# Patient Record
Sex: Female | Born: 1990 | Race: White | Hispanic: No | Marital: Married | Smoking: Never smoker
Health system: Southern US, Community
[De-identification: ages and names within clinical notes are randomized; demographics above are authoritative.]

## PROBLEM LIST (undated history)

## (undated) ENCOUNTER — Inpatient Hospital Stay: Payer: Self-pay

## (undated) DIAGNOSIS — N390 Urinary tract infection, site not specified: Secondary | ICD-10-CM

## (undated) DIAGNOSIS — O24419 Gestational diabetes mellitus in pregnancy, unspecified control: Secondary | ICD-10-CM

## (undated) DIAGNOSIS — J069 Acute upper respiratory infection, unspecified: Secondary | ICD-10-CM

## (undated) DIAGNOSIS — Z789 Other specified health status: Secondary | ICD-10-CM

## (undated) HISTORY — DX: Gestational diabetes mellitus in pregnancy, unspecified control: O24.419

## (undated) HISTORY — PX: NO PAST SURGERIES: SHX2092

## (undated) HISTORY — DX: Urinary tract infection, site not specified: N39.0

---
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
## 2010-06-19 ENCOUNTER — Ambulatory Visit: Payer: Self-pay | Admitting: Surgery

## 2011-07-14 ENCOUNTER — Ambulatory Visit: Payer: Self-pay

## 2014-09-05 LAB — HM PAP SMEAR: HM PAP: NORMAL

## 2014-10-27 NOTE — L&D Delivery Note (Signed)
Delivery Note At 12:26 AM a viable female was delivered via Vaginal, Spontaneous Delivery (Presentation: ; Occiput Anterior).  APGAR: 7, 9; weight  Pending due to skin-to-skin time.   Placenta status: Intact, Spontaneous Expressed.  Cord: 3 vessels with the following complications: None.  Cord pH: Not obtained  Anesthesia: Epidural  Episiotomy: None Lacerations: 1st degree;Vaginal Suture Repair: 3.0 vicryl Est. Blood Loss (mL): 400  Mom to postpartum.  Baby to Couplet care / Skin to Skin.  Called to see patient.  Mom pushed to delivery viable female infant.  The head followed by shoulders, which delivered without difficulty, and the rest of the body.  No nuchal cord noted.  Baby to mom's chest.  Cord clamped and cut after > 1 min delay.  No cord blood obtained.  Placenta delivered spontaneously, intact, with a 3-vessel cord.  First degree vaginal laceration repaired with 3-0 Vicryl in standard fashion.  All counts correct.  Hemostasis obtained with IV pitocin and fundal massage. EBL 400 mL.    Conard NovakJackson, Emmett Bracknell D, MD 09/08/2015, 12:49 AM

## 2015-01-10 LAB — OB RESULTS CONSOLE HIV ANTIBODY (ROUTINE TESTING): HIV: NONREACTIVE

## 2015-01-10 LAB — OB RESULTS CONSOLE ABO/RH: RH TYPE: POSITIVE

## 2015-01-10 LAB — OB RESULTS CONSOLE GC/CHLAMYDIA
Chlamydia: NEGATIVE
Gonorrhea: NEGATIVE

## 2015-01-10 LAB — OB RESULTS CONSOLE RUBELLA ANTIBODY, IGM: RUBELLA: IMMUNE

## 2015-01-10 LAB — OB RESULTS CONSOLE HEPATITIS B SURFACE ANTIGEN: HEP B S AG: NEGATIVE

## 2015-01-10 LAB — OB RESULTS CONSOLE VARICELLA ZOSTER ANTIBODY, IGG: Varicella: IMMUNE

## 2015-01-10 LAB — OB RESULTS CONSOLE ANTIBODY SCREEN: Antibody Screen: NEGATIVE

## 2015-01-10 LAB — OB RESULTS CONSOLE RPR: RPR: NONREACTIVE

## 2015-08-17 LAB — OB RESULTS CONSOLE GC/CHLAMYDIA
Chlamydia: NEGATIVE
Gonorrhea: NEGATIVE

## 2015-08-17 LAB — OB RESULTS CONSOLE GBS: GBS: NEGATIVE

## 2015-09-01 ENCOUNTER — Observation Stay
Admission: EM | Admit: 2015-09-01 | Discharge: 2015-09-01 | Disposition: A | Discharge status: Home / Self Care | Payer: BLUE CROSS/BLUE SHIELD | Attending: Obstetrics and Gynecology | Admitting: Obstetrics and Gynecology

## 2015-09-01 ENCOUNTER — Encounter: Payer: Self-pay | Admitting: *Deleted

## 2015-09-01 DIAGNOSIS — Z3400 Encounter for supervision of normal first pregnancy, unspecified trimester: Secondary | ICD-10-CM

## 2015-09-01 DIAGNOSIS — Z3A40 40 weeks gestation of pregnancy: Secondary | ICD-10-CM | POA: Insufficient documentation

## 2015-09-01 HISTORY — DX: Other specified health status: Z78.9

## 2015-09-01 NOTE — OB Triage Note (Signed)
Cramping started yesterday after membranes stripped in office. Contractions with back pain since last night. Worsening this am around 0930. Reports loss of mucous plug.Elaina HoopsElks, Muhammed Teutsch S

## 2015-09-01 NOTE — Final Progress Note (Signed)
Physician Final Progress Note  Patient ID: Tiffany NoaStacie N Shaw Poke MRN: [id number] DOB/AGE: 25/03/1991 24 y.o.  Admit date: 09/01/2015 Admitting provider: Conard NovakStephen D Adonys Wildes, MD Discharge date: 09/01/2015   Admission Diagnoses:  1) Intrauterine pregnancy at 3676w1d  2) regular uterine contractions  Discharge Diagnoses:  1) Intrauterine pregnancy at 476w1d  2) regular uterine contractions, no evidence of labor   Consults: None  Significant Findings/ Diagnostic Studies: Reactive NST (baseline 140/ mod var / +accels / a single periodic deceleration)  Procedures: NST, as above  Discharge Condition: stable  Disposition: Final discharge disposition not confirmed  Diet: Regular diet  Discharge Activity: Activity as tolerated     Medication List    TAKE these medications        prenatal vitamin w/FE, FA 27-1 MG Tabs tablet  Take 1 tablet by mouth daily at 12 noon.         Total time spent taking care of this patient: 20 minutes  Signed: Conard NovakJackson, Nikola Marone D 09/01/2015, 12:53 PM

## 2015-09-03 ENCOUNTER — Observation Stay
Admission: EM | Admit: 2015-09-03 | Discharge: 2015-09-04 | Disposition: A | Discharge status: Home / Self Care | Payer: BLUE CROSS/BLUE SHIELD | Attending: Obstetrics and Gynecology | Admitting: Obstetrics and Gynecology

## 2015-09-03 ENCOUNTER — Encounter: Payer: Self-pay | Admitting: *Deleted

## 2015-09-03 DIAGNOSIS — Z3403 Encounter for supervision of normal first pregnancy, third trimester: Secondary | ICD-10-CM

## 2015-09-03 DIAGNOSIS — Z3A39 39 weeks gestation of pregnancy: Secondary | ICD-10-CM | POA: Diagnosis not present

## 2015-09-03 DIAGNOSIS — O4292 Full-term premature rupture of membranes, unspecified as to length of time between rupture and onset of labor: Principal | ICD-10-CM | POA: Insufficient documentation

## 2015-09-03 NOTE — OB Triage Note (Signed)
Pt reports being here on Sun with query early labor, d/c'ed home. Today noticed leaking of cl fluid vaginally onsetting at 1900. No u/c's reported

## 2015-09-03 NOTE — Final Progress Note (Signed)
Physician Final Progress Note  Patient ID: Tiffany LoaderStacie Bertram MRN: [id number] DOB/AGE: 25/03/1991 24 y.o.  Admit date: 09/03/2015 Admitting provider: Vena AustriaAndreas Skyylar Kopf, MD Discharge date: 09/03/2015   Admission Diagnoses: Leaking fluid  Discharge Diagnoses:  Active Problems:   Amniotic fluid leaking   Consults: none  Significant Findings/ Diagnostic Studies:  NST reactive, negative ferning, negative pooling, negative nitrazine  Procedures: NST  Discharge Condition: stable  Disposition: 01-Home or Self Care  Diet: general  Discharge Activity: as tolerated  Discharge Instructions    Discharge activity:  No Restrictions    Complete by:  As directed      Discharge diet:  No restrictions    Complete by:  As directed      Fetal Kick Count:  Lie on our left side for one hour after a meal, and count the number of times your baby kicks.  If it is less than 5 times, get up, move around and drink some juice.  Repeat the test 30 minutes later.  If it is still less than 5 kicks in an hour, notify your doctor.    Complete by:  As directed      LABOR:  When conractions begin, you should start to time them from the beginning of one contraction to the beginning  of the next.  When contractions are 5 - 10 minutes apart or less and have been regular for at least an hour, you should call your health care provider.    Complete by:  As directed      No sexual activity restrictions    Complete by:  As directed      Notify physician for bleeding from the vagina    Complete by:  As directed      Notify physician for blurring of vision or spots before the eyes    Complete by:  As directed      Notify physician for chills or fever    Complete by:  As directed      Notify physician for fainting spells, "black outs" or loss of consciousness    Complete by:  As directed      Notify physician for increase in vaginal discharge    Complete by:  As directed      Notify physician for leaking of fluid     Complete by:  As directed      Notify physician for pain or burning when urinating    Complete by:  As directed      Notify physician for pelvic pressure (sudden increase)    Complete by:  As directed      Notify physician for severe or continued nausea or vomiting    Complete by:  As directed      Notify physician for sudden gushing of fluid from the vagina (with or without continued leaking)    Complete by:  As directed      Notify physician for sudden, constant, or occasional abdominal pain    Complete by:  As directed      Notify physician if baby moving less than usual    Complete by:  As directed             Medication List    TAKE these medications        prenatal vitamin w/FE, FA 27-1 MG Tabs tablet  Take 1 tablet by mouth daily at 12 noon.        Total time spent taking care of this patient: 20 minutes  SignedLorrene Reid 09/03/2015, 11:40 PM

## 2015-09-04 DIAGNOSIS — O4292 Full-term premature rupture of membranes, unspecified as to length of time between rupture and onset of labor: Secondary | ICD-10-CM | POA: Diagnosis not present

## 2015-09-04 NOTE — OB Triage Note (Signed)
Pt d/c'ed walking with spouse to car. Dr Bonney AidStaebler providing d/c instructions. Instructions printed and reviewed with pt, spouse present. No further questions or concerns.

## 2015-09-07 ENCOUNTER — Encounter: Payer: Self-pay | Admitting: *Deleted

## 2015-09-07 ENCOUNTER — Inpatient Hospital Stay: Payer: BLUE CROSS/BLUE SHIELD | Admitting: Anesthesiology

## 2015-09-07 ENCOUNTER — Inpatient Hospital Stay
Admission: EM | Admit: 2015-09-07 | Discharge: 2015-09-09 | DRG: 775 | Disposition: A | Discharge status: Home / Self Care | Payer: BLUE CROSS/BLUE SHIELD | Attending: Obstetrics and Gynecology | Admitting: Obstetrics and Gynecology

## 2015-09-07 DIAGNOSIS — Z3A40 40 weeks gestation of pregnancy: Secondary | ICD-10-CM | POA: Diagnosis not present

## 2015-09-07 DIAGNOSIS — O4290 Premature rupture of membranes, unspecified as to length of time between rupture and onset of labor, unspecified weeks of gestation: Secondary | ICD-10-CM | POA: Diagnosis present

## 2015-09-07 DIAGNOSIS — Z3403 Encounter for supervision of normal first pregnancy, third trimester: Secondary | ICD-10-CM

## 2015-09-07 DIAGNOSIS — O133 Gestational [pregnancy-induced] hypertension without significant proteinuria, third trimester: Secondary | ICD-10-CM | POA: Diagnosis present

## 2015-09-07 DIAGNOSIS — O4200 Premature rupture of membranes, onset of labor within 24 hours of rupture, unspecified weeks of gestation: Secondary | ICD-10-CM | POA: Diagnosis present

## 2015-09-07 DIAGNOSIS — O4210 Premature rupture of membranes, onset of labor more than 24 hours following rupture, unspecified weeks of gestation: Secondary | ICD-10-CM | POA: Diagnosis present

## 2015-09-07 DIAGNOSIS — O134 Gestational [pregnancy-induced] hypertension without significant proteinuria, complicating childbirth: Secondary | ICD-10-CM | POA: Diagnosis present

## 2015-09-07 DIAGNOSIS — Z3400 Encounter for supervision of normal first pregnancy, unspecified trimester: Secondary | ICD-10-CM

## 2015-09-07 LAB — CHLAMYDIA/NGC RT PCR (ARMC ONLY)
Chlamydia Tr: NOT DETECTED
N gonorrhoeae: NOT DETECTED

## 2015-09-07 LAB — COMPREHENSIVE METABOLIC PANEL
ALT: 15 U/L (ref 14–54)
AST: 26 U/L (ref 15–41)
Albumin: 3.4 g/dL — ABNORMAL LOW (ref 3.5–5.0)
Alkaline Phosphatase: 128 U/L — ABNORMAL HIGH (ref 38–126)
Anion gap: 7 (ref 5–15)
BILIRUBIN TOTAL: 0.6 mg/dL (ref 0.3–1.2)
BUN: 8 mg/dL (ref 6–20)
CO2: 23 mmol/L (ref 22–32)
CREATININE: 0.56 mg/dL (ref 0.44–1.00)
Calcium: 9.4 mg/dL (ref 8.9–10.3)
Chloride: 107 mmol/L (ref 101–111)
GFR calc Af Amer: >60 mL/min (ref >60)
Glucose, Bld: 82 mg/dL (ref 65–99)
Potassium: 3.6 mmol/L (ref 3.5–5.1)
Sodium: 137 mmol/L (ref 135–145)
TOTAL PROTEIN: 7 g/dL (ref 6.5–8.1)

## 2015-09-07 LAB — CBC
HCT: 35.1 % (ref 35.0–47.0)
Hemoglobin: 11.9 g/dL — ABNORMAL LOW (ref 12.0–16.0)
MCH: 31.1 pg (ref 26.0–34.0)
MCHC: 34 g/dL (ref 32.0–36.0)
MCV: 91.5 fL (ref 80.0–100.0)
PLATELETS: 135 10*3/uL — AB (ref 150–440)
RBC: 3.83 MIL/uL (ref 3.80–5.20)
RDW: 13 % (ref 11.5–14.5)
WBC: 8.9 10*3/uL (ref 3.6–11.0)

## 2015-09-07 LAB — PROTEIN / CREATININE RATIO, URINE
CREATININE, URINE: 108 mg/dL
Protein Creatinine Ratio: 0.23 mg/mg{Cre} — ABNORMAL HIGH (ref 0.00–0.15)
Total Protein, Urine: 25 mg/dL

## 2015-09-07 LAB — TYPE AND SCREEN
ABO/RH(D): AB POS
Antibody Screen: NEGATIVE

## 2015-09-07 LAB — ABO/RH: ABO/RH(D): AB POS

## 2015-09-07 MED ORDER — LACTATED RINGERS IV SOLN
INTRAVENOUS | Status: DC
  Administered 2015-09-07: 14:00:00 via INTRAVENOUS

## 2015-09-07 MED ORDER — MISOPROSTOL 200 MCG PO TABS
800.0000 ug | ORAL_TABLET | ORAL | Status: DC | PRN
  Filled 2015-09-07 (×2): qty 4

## 2015-09-07 MED ORDER — LIDOCAINE-EPINEPHRINE (PF) 1.5 %-1:200000 IJ SOLN
INTRAMUSCULAR | Status: DC | PRN
  Administered 2015-09-07: 3 mL via EPIDURAL

## 2015-09-07 MED ORDER — OXYTOCIN 40 UNITS IN LACTATED RINGERS INFUSION - SIMPLE MED: 62.5000 mL/h | INTRAVENOUS | Status: DC

## 2015-09-07 MED ORDER — FENTANYL 2.5 MCG/ML W/ROPIVACAINE 0.2% IN NS 100 ML EPIDURAL INFUSION (ARMC-ANES)
EPIDURAL | Status: AC
  Administered 2015-09-07: 10 mL/h via EPIDURAL
  Filled 2015-09-07: qty 100

## 2015-09-07 MED ORDER — ONDANSETRON HCL 4 MG/2ML IJ SOLN: 4.0000 mg | Freq: Four times a day (QID) | INTRAMUSCULAR | Status: DC | PRN

## 2015-09-07 MED ORDER — OXYTOCIN BOLUS FROM INFUSION
500.0000 mL | INTRAVENOUS | Status: DC
  Administered 2015-09-08: 500 mL via INTRAVENOUS

## 2015-09-07 MED ORDER — TERBUTALINE SULFATE 1 MG/ML IJ SOLN: 0.2500 mg | Freq: Once | INTRAMUSCULAR | Status: DC | PRN

## 2015-09-07 MED ORDER — FENTANYL CITRATE (PF) 100 MCG/2ML IJ SOLN
50.0000 ug | INTRAMUSCULAR | Status: AC
  Administered 2015-09-07: 50 ug via INTRAVENOUS
  Filled 2015-09-07: qty 2

## 2015-09-07 MED ORDER — SODIUM CHLORIDE 0.9 % IV SOLN
INTRAVENOUS | Status: DC | PRN
  Administered 2015-09-07 (×2): 5 mL via EPIDURAL

## 2015-09-07 MED ORDER — OXYTOCIN 40 UNITS IN LACTATED RINGERS INFUSION - SIMPLE MED
1.0000 m[IU]/min | INTRAVENOUS | Status: DC
  Administered 2015-09-07: 1 m[IU]/min via INTRAVENOUS
  Filled 2015-09-07: qty 1000

## 2015-09-07 MED ORDER — LACTATED RINGERS IV SOLN: 500.0000 mL | INTRAVENOUS | Status: DC | PRN

## 2015-09-07 MED ORDER — AMMONIA AROMATIC IN INHA
0.3000 mL | Freq: Once | RESPIRATORY_TRACT | Status: DC | PRN
  Filled 2015-09-07: qty 10

## 2015-09-07 MED ORDER — LIDOCAINE HCL (PF) 1 % IJ SOLN
30.0000 mL | INTRAMUSCULAR | Status: DC | PRN
  Filled 2015-09-07: qty 30

## 2015-09-07 NOTE — Anesthesia Procedure Notes (Signed)
Epidural Patient location during procedure: OB Start time: 09/07/2015 7:24 PM End time: 09/07/2015 7:34 PM  Staffing Anesthesiologist: Lenard SimmerKARENZ, Erling Arrazola Performed by: anesthesiologist   Preanesthetic Checklist Completed: patient identified, site marked, surgical consent, pre-op evaluation, timeout performed, IV checked, risks and benefits discussed and monitors and equipment checked  Epidural Patient position: sitting Prep: ChloraPrep Patient monitoring: heart rate, continuous pulse ox and blood pressure Approach: midline Location: L3-L4 Injection technique: LOR saline  Needle:  Needle type: Tuohy  Needle gauge: 18 G Needle length: 9 cm and 9 Needle insertion depth: 4.5 cm Catheter type: closed end flexible Catheter size: 20 Guage Catheter at skin depth: 9 cm Test dose: negative and 1.5% lidocaine with Epi 1:200 K  Assessment Events: blood not aspirated, injection not painful, no injection resistance, negative IV test and no paresthesia  Additional Notes   Patient tolerated the insertion well without complications.Reason for block:procedure for pain

## 2015-09-07 NOTE — Anesthesia Preprocedure Evaluation (Signed)
Anesthesia Evaluation  Patient identified by MRN, date of birth, ID band Patient awake    Reviewed: Allergy & Precautions, H&P , NPO status , Patient's Chart, lab work & pertinent test results, reviewed documented beta blocker date and time   History of Anesthesia Complications Negative for: history of anesthetic complications  Airway Mallampati: III  TM Distance: >3 FB Neck ROM: full    Dental no notable dental hx. (+) Teeth Intact   Pulmonary neg pulmonary ROS,    Pulmonary exam normal breath sounds clear to auscultation       Cardiovascular Exercise Tolerance: Good negative cardio ROS Normal cardiovascular exam Rhythm:regular Rate:Normal     Neuro/Psych negative neurological ROS  negative psych ROS   GI/Hepatic negative GI ROS, Neg liver ROS,   Endo/Other  negative endocrine ROS  Renal/GU negative Renal ROS  negative genitourinary   Musculoskeletal   Abdominal   Peds  Hematology negative hematology ROS (+)   Anesthesia Other Findings Past Medical History:   Medical history non-contributory                             Reproductive/Obstetrics (+) Pregnancy                             Anesthesia Physical Anesthesia Plan  ASA: II  Anesthesia Plan: Epidural   Post-op Pain Management:    Induction:   Airway Management Planned:   Additional Equipment:   Intra-op Plan:   Post-operative Plan:   Informed Consent: I have reviewed the patients History and Physical, chart, labs and discussed the procedure including the risks, benefits and alternatives for the proposed anesthesia with the patient or authorized representative who has indicated his/her understanding and acceptance.   Dental Advisory Given  Plan Discussed with: Anesthesiologist, CRNA and Surgeon  Anesthesia Plan Comments:         Anesthesia Quick Evaluation

## 2015-09-07 NOTE — Progress Notes (Addendum)
Patient ID: Arsenio LoaderStacie Veras, female   DOB: 01/25/1991, 24 y.o.   MRN: [id number] L&D Note    Subjective:  Becoming more uncomfortable, asking for pain medication.  Objective:   Filed Vitals:   09/07/15 1419 09/07/15 1434 09/07/15 1449 09/07/15 1559  BP: 138/76 143/80 136/86 140/85  Pulse: 99 96 89 90  Temp:      TempSrc:      Resp:      Height:      Weight:        Current Vital Signs 24h Vital Sign Ranges  T 98.7 F (37.1 C) Temp  Avg: 98.7 F (37.1 C)  Min: 98.7 F (37.1 C)  Max: 98.7 F (37.1 C)  BP 140/85 mmHg BP  Min: 130/91  Max: 143/80  HR 90 Pulse  Avg: 93.9  Min: 87  Max: 107  RR 18 Resp  Avg: 18  Min: 18  Max: 18  SaO2     No Data Recorded      Gen: moderate distress w ctx FHR: 130/mod var/+accels/no decles Toco: 3  q 10 min SVE: 4/90/-1  Lab Results  Component Value Date   WBC 8.9 09/07/2015   HGB 11.9* 09/07/2015   HCT 35.1 09/07/2015   PLT 135* 09/07/2015   CREATININE 0.56 09/07/2015   PROTCRRATIO 0.23* 09/07/2015    Medications SCHEDULED MEDICATIONS  . fentaNYL (SUBLIMAZE) injection  50 mcg Intravenous STAT    MEDICATION INFUSIONS  . lactated ringers 125 mL/hr at 09/07/15 1400  . oxytocin 40 units in LR 1000 mL    . oxytocin 40 units in LR 1000 mL    . oxytocin 40 units in LR 1000 mL 9 milli-units/min (09/07/15 1630)    PRN MEDICATIONS  ammonia, lactated ringers, lidocaine (PF), misoprostol, ondansetron, terbutaline   Assessment & Plan:  24 y.o. G1P0000 at 7133w0d PROM, gestational hypertension *Labor: continue pitocin per protocol *Fetal Well-being: reassuring overall *GBS:neg *Gestational hypertension: monitor for now.  No evidence of preeclampsia. *Analgesia: fentanyl 50mcg x 1 now.  Patient now interested in epidural. May have whenever.  Conard NovakJackson, Aldean Pipe D, MD  09/07/2015 5:42 PM  [facility]

## 2015-09-07 NOTE — H&P (Signed)
OB History & Physical   History of Present Illness:  Chief Complaint: Sent over from office for verified rupture of membranes  HPI:  Tiffany Wiley is a 24 y.o. G1P0000 female at 6568w0d dated by 8 week ultrasound.  Her pregnancy has been uncomplicated.    She reports contractions intermittently and that are not very strong.   She reports leakage of fluid since Wednesday (two days ago).  She has had several hospital visits with concern for fluid leakage, and has tested negative each time.   She denies vaginal bleeding.   She reports fetal movement.  She denies fevers and abdominal pain.  She denies headache, visual disturbances, and RUQ pain.  Maternal Medical History:   Past Medical History  Diagnosis Date  . Medical history non-contributory     Past Surgical History  Procedure Laterality Date  . No past surgeries      Allergies  Allergen Reactions  . Other Hives, Shortness Of Breath and Swelling    Blueberries  . Shellfish Allergy Hives and Swelling    Prior to Admission medications   Medication Sig Start Date End Date Taking? Authorizing Provider  prenatal vitamin w/FE, FA (PRENATAL 1 + 1) 27-1 MG TABS tablet Take 1 tablet by mouth daily at 12 noon.   Yes Historical Provider, MD    OB History  Gravida Para Term Preterm AB SAB TAB Ectopic Multiple Living  1 0 0 0 0 0 0 0 0 0     # Outcome Date GA Lbr Len/2nd Weight Sex Delivery Anes PTL Lv  1 Current               Prenatal care site: [facility]  Social History: She  reports that she has never smoked. She has never used smokeless tobacco. She reports that she does not drink alcohol or use illicit drugs.  Family History: family history is not on file.   Review of Systems: Negative x 10 systems reviewed except as noted in the HPI.    Physical Exam:  Vital Signs: BP 130/91 mmHg  Pulse 107  Temp(Src) 98.7 F (37.1 C) (Oral)  Resp 18  Ht 5\' 3"  (1.6 m)  Wt 162 lb (73.483 kg)  BMI 28.70 kg/m2 General: no acute  distress.  HEENT: normocephalic, atraumatic Heart: regular rate & rhythm.  No murmurs/rubs/gallops Lungs: clear to auscultation bilaterally Abdomen: soft, gravid, non-tender;  EFW: 8 pounds Pelvic:   External: Normal external female genitalia  Cervix: Dilation: 3 / Effacement (%): 70, 80 / Station: -2   ROM: + pooling; + nitrazine; + ferning in clinic today.  Not repeated on L&D Extremities: non-tender, symmetric, no edema bilaterally.  DTRs: 2+  Neurologic: Alert & oriented x 3.    Pertinent Results:  Prenatal Labs: Blood type/Rh AB positive  Antibody screen negative  Rubella Varicella Immune Immune  RPR Non reactive  HBsAg negative  HIV negative  GC negative  Chlamydia negative  Genetic screening declined  1 hour GTT 131  3 hour GTT n/a  GBS negative on 08/15/15   Baseline FHR: 135 beats/min   Variability: moderate   Accelerations: present   Decelerations: absent Contractions: present frequency: infrequenty Overall assessment: category 1  Assessment:  Tiffany Wiley is a 24 y.o. 401P0000 female at 5968w0d with premature rupture of membranes for possibly 48 hours.   Plan:  1. Admit to Labor & Delivery  2. CBC, T&S, Clrs, IVF 3. Elevated BPs: preeclampsia labs, urine protein/creatinine ratio. 4. PROM for 48 hours: will  monitor for signs of chorioamnionitis. Per CDC guidelines, will only treat with fever. 5. GBS negative.   6. Fetwal well-being: 08/15/15   Conard Novak, MD 09/07/2015 1:48 PM

## 2015-09-08 ENCOUNTER — Encounter: Payer: Self-pay | Admitting: Obstetrics and Gynecology

## 2015-09-08 LAB — CBC
HEMATOCRIT: 32.4 % — AB (ref 35.0–47.0)
HEMOGLOBIN: 11 g/dL — AB (ref 12.0–16.0)
MCH: 31 pg (ref 26.0–34.0)
MCHC: 34 g/dL (ref 32.0–36.0)
MCV: 91.3 fL (ref 80.0–100.0)
Platelets: 121 10*3/uL — ABNORMAL LOW (ref 150–440)
RBC: 3.54 MIL/uL — ABNORMAL LOW (ref 3.80–5.20)
RDW: 13.2 % (ref 11.5–14.5)
WBC: 14.8 10*3/uL — ABNORMAL HIGH (ref 3.6–11.0)

## 2015-09-08 LAB — RPR: RPR: NONREACTIVE

## 2015-09-08 MED ORDER — PRENATAL MULTIVITAMIN CH
1.0000 | ORAL_TABLET | Freq: Every day | ORAL | Status: DC
  Administered 2015-09-08 – 2015-09-09 (×2): 1 via ORAL
  Filled 2015-09-08 (×2): qty 1

## 2015-09-08 MED ORDER — DIBUCAINE 1 % RE OINT: 1.0000 "application " | TOPICAL_OINTMENT | RECTAL | Status: DC | PRN

## 2015-09-08 MED ORDER — NALBUPHINE HCL 10 MG/ML IJ SOLN
5.0000 mg | INTRAMUSCULAR | Status: DC | PRN
  Filled 2015-09-08: qty 0.5

## 2015-09-08 MED ORDER — IBUPROFEN 600 MG PO TABS
600.0000 mg | ORAL_TABLET | Freq: Four times a day (QID) | ORAL | Status: DC
Start: 2015-09-08 — End: 2015-09-09
  Administered 2015-09-08 – 2015-09-09 (×4): 600 mg via ORAL
  Filled 2015-09-08 (×4): qty 1

## 2015-09-08 MED ORDER — NALBUPHINE HCL 10 MG/ML IJ SOLN
5.0000 mg | Freq: Once | INTRAMUSCULAR | Status: DC | PRN
  Filled 2015-09-08: qty 0.5

## 2015-09-08 MED ORDER — DIPHENHYDRAMINE HCL 50 MG/ML IJ SOLN: 12.5000 mg | INTRAMUSCULAR | Status: DC | PRN

## 2015-09-08 MED ORDER — ONDANSETRON HCL 4 MG/2ML IJ SOLN: 4.0000 mg | INTRAMUSCULAR | Status: DC | PRN

## 2015-09-08 MED ORDER — WITCH HAZEL-GLYCERIN EX PADS: 1.0000 "application " | MEDICATED_PAD | CUTANEOUS | Status: DC | PRN

## 2015-09-08 MED ORDER — BENZOCAINE-MENTHOL 20-0.5 % EX AERO: 1.0000 "application " | INHALATION_SPRAY | CUTANEOUS | Status: DC | PRN

## 2015-09-08 MED ORDER — ONDANSETRON HCL 4 MG PO TABS: 4.0000 mg | ORAL_TABLET | ORAL | Status: DC | PRN

## 2015-09-08 MED ORDER — LANOLIN HYDROUS EX OINT: TOPICAL_OINTMENT | CUTANEOUS | Status: DC | PRN

## 2015-09-08 MED ORDER — ACETAMINOPHEN 325 MG PO TABS: 650.0000 mg | ORAL_TABLET | ORAL | Status: DC | PRN

## 2015-09-08 MED ORDER — HYDROCODONE-ACETAMINOPHEN 5-325 MG PO TABS: 2.0000 | ORAL_TABLET | ORAL | Status: DC | PRN

## 2015-09-08 MED ORDER — KETOROLAC TROMETHAMINE 30 MG/ML IJ SOLN: 30.0000 mg | Freq: Four times a day (QID) | INTRAMUSCULAR | Status: DC | PRN

## 2015-09-08 MED ORDER — SIMETHICONE 80 MG PO CHEW: 80.0000 mg | CHEWABLE_TABLET | ORAL | Status: DC | PRN

## 2015-09-08 MED ORDER — NALOXONE HCL 2 MG/2ML IJ SOSY
1.0000 ug/kg/h | PREFILLED_SYRINGE | INTRAVENOUS | Status: DC | PRN
  Filled 2015-09-08: qty 2

## 2015-09-08 MED ORDER — SODIUM CHLORIDE 0.9 % IJ SOLN: 3.0000 mL | INTRAMUSCULAR | Status: DC | PRN

## 2015-09-08 MED ORDER — MEPERIDINE HCL 25 MG/ML IJ SOLN: 6.2500 mg | INTRAMUSCULAR | Status: DC | PRN

## 2015-09-08 MED ORDER — ONDANSETRON HCL 4 MG/2ML IJ SOLN: 4.0000 mg | Freq: Three times a day (TID) | INTRAMUSCULAR | Status: DC | PRN

## 2015-09-08 MED ORDER — OXYTOCIN 40 UNITS IN LACTATED RINGERS INFUSION - SIMPLE MED: 62.5000 mL/h | INTRAVENOUS | Status: DC | PRN

## 2015-09-08 MED ORDER — NALOXONE HCL 0.4 MG/ML IJ SOLN: 0.4000 mg | INTRAMUSCULAR | Status: DC | PRN

## 2015-09-08 MED ORDER — FENTANYL 2.5 MCG/ML W/ROPIVACAINE 0.2% IN NS 100 ML EPIDURAL INFUSION (ARMC-ANES): 10.0000 mL/h | EPIDURAL | Status: DC

## 2015-09-08 MED ORDER — DIPHENHYDRAMINE HCL 25 MG PO CAPS: 25.0000 mg | ORAL_CAPSULE | ORAL | Status: DC | PRN

## 2015-09-08 MED ORDER — DIPHENHYDRAMINE HCL 25 MG PO CAPS: 25.0000 mg | ORAL_CAPSULE | Freq: Four times a day (QID) | ORAL | Status: DC | PRN

## 2015-09-08 MED ORDER — HYDROCODONE-ACETAMINOPHEN 5-325 MG PO TABS: 1.0000 | ORAL_TABLET | ORAL | Status: DC | PRN

## 2015-09-08 MED ORDER — FERROUS SULFATE 325 (65 FE) MG PO TABS
325.0000 mg | ORAL_TABLET | Freq: Two times a day (BID) | ORAL | Status: DC
  Administered 2015-09-08 – 2015-09-09 (×2): 325 mg via ORAL
  Filled 2015-09-08 (×2): qty 1

## 2015-09-08 MED ORDER — SENNOSIDES-DOCUSATE SODIUM 8.6-50 MG PO TABS: 2.0000 | ORAL_TABLET | ORAL | Status: DC

## 2015-09-08 NOTE — Discharge Summary (Signed)
OB Discharge Summary  Patient Name: Tiffany Wiley DOB: 1991-07-07 MRN: [id number]  Date of admission: 09/07/2015 Delivering MD: Thomasene Mohair, MD Date of Delivery: 09/08/2015  Date of discharge: 09/09/2015  Admitting diagnosis:  Patient Active Problem List   Diagnosis Date Noted  . Prolonged premature rupture of membranes 09/07/2015  . [redacted] weeks gestation of pregnancy 09/07/2015  . Indication for care in labor or delivery 09/07/2015  . Gestational hypertension w/o significant proteinuria in 3rd trimester 09/07/2015  . Supervision of normal first pregnancy 09/01/2015   Intrauterine pregnancy: [redacted]w[redacted]d      Secondary diagnosis: Gestational Hypertension     Discharge diagnosis: Term Pregnancy Delivered and Gestational Hypertension                                                                                                Post partum procedures:none  Augmentation: Pitocin  Complications: None  Hospital course:  Induction of Labor With Vaginal Delivery   24 y.o. yo G1P1000 at [redacted]w[redacted]d was admitted to the hospital 09/07/2015 for induction of labor.  Indication for induction: PROM.  Patient had an uncomplicated labor course as follows: Membrane Rupture Time/Date: 8:30 PM ,09/05/2015  (two days prior to admission) Intrapartum Procedures: Episiotomy: None [1]                                         Lacerations:  1st degree [2];Vaginal [6]  Patient had delivery of a Viable infant.  Information for the patient's newborn:  Ellaina, Schuler [[id number]]  Delivery Method: Vag-Spont    09/08/2015  Details of delivery can be found in separate delivery note.  Patient had a routine postpartum course. Patient is discharged home. Physical exam  Filed Vitals:   09/08/15 1637 09/08/15 1922 09/09/15 0023 09/09/15 0800  BP: 126/80 119/70 114/69 124/78  Pulse: 78 88 93 76  Temp: 98 F (36.7 C) 98.6 F (37 C) 98.5 F (36.9 C) 97.8 F (36.6 C)  TempSrc: Oral Oral Axillary Oral  Resp: Height:      Weight:      SpO2:   98% 99%   General: alert  CV: RRR Pulm: CTABL nl effort Lochia: appropriate Uterine Fundus: firm, below umbilicus Incision: N/A DVT Evaluation: No evidence of DVT seen on physical exam.  Labs: Lab Results  Component Value Date   WBC 14.8* 09/08/2015   HGB 11.0* 09/08/2015   HCT 32.4* 09/08/2015   MCV 91.3 09/08/2015   PLT 121* 09/08/2015   CMP Latest Ref Rng 09/07/2015  Glucose 65 - 99 mg/dL 82  BUN 6 - 20 mg/dL 8  Creatinine 7.82 - 9.56 mg/dL 2.13  Sodium 086 - 578 mmol/L 137  Potassium 3.5 - 5.1 mmol/L 3.6  Chloride 101 - 111 mmol/L 107  CO2 22 - 32 mmol/L 23  Calcium 8.9 - 10.3 mg/dL 9.4  Total Protein 6.5 - 8.1 g/dL 7.0  Total Bilirubin 0.3 - 1.2 mg/dL 0.6  Alkaline Phos 38 - 126 U/L 128(H)  AST 15 - 41 U/L 26  ALT 14 - 54 U/L 15   Recent Labs     09/07/15  1437  PROTCRRATIO  0.23*     Discharge instruction: per After Visit Summary.  Medications:    Medication List    TAKE these medications        acetaminophen 325 MG tablet  Commonly known as:  TYLENOL  Take 2 tablets (650 mg total) by mouth every 4 (four) hours as needed (for pain scale < 4).     ibuprofen 600 MG tablet  Commonly known as:  ADVIL,MOTRIN  Take 1 tablet (600 mg total) by mouth every 6 (six) hours.     prenatal vitamin w/FE, FA 27-1 MG Tabs tablet  Take 1 tablet by mouth daily at 12 noon.        Diet: routine diet  Activity: Advance as tolerated. Pelvic rest for 6 weeks.   Outpatient follow up: Follow-up Information    Follow up with Conard NovakJackson, Stephen D, MD In 6 weeks.   Specialty:  Obstetrics and Gynecology   Why:  for routine post partum check   Contact information:   [address] [place] [id number] [phone]      Postpartum contraception: IUD Mirena Rhogam Given postpartum: no Rubella vaccine given postpartum: no Varicella vaccine given postpartum: no TDaP given antepartum or postpartum: TDaP given 07/17/15 Flu  vaccine given antepartum or postpartum: yes  Newborn Data: Live born female  Birth Weight:   APGAR: 7, 9   Baby Feeding: Breast  Disposition:home with mother  Idus Rathke, Elenora Fenderhelsea C, MD 09/09/2015 11:07 AM

## 2015-09-08 NOTE — Anesthesia Postprocedure Evaluation (Signed)
  Anesthesia Post-op Note  Patient: Tiffany Wiley  Procedure(s) Performed: Epidural  Anesthesia type:  Patient location: L&D  Post pain: Pain level controlled  Post assessment: Post-op Vital signs reviewed, Patient's Cardiovascular Status Stable, Respiratory Function Stable, Patent Airway and No signs of Nausea or vomiting  Post vital signs: Reviewed and stable  Last Vitals:  Filed Vitals:   09/08/15 0436  BP: 134/84  Pulse: 95  Temp: 36.6 C  Resp: 20    Level of consciousness: awake, alert  and patient cooperative  Complications: No apparent anesthesia complications

## 2015-09-09 MED ORDER — IBUPROFEN 600 MG PO TABS: 600.0000 mg | ORAL_TABLET | Freq: Four times a day (QID) | ORAL | Status: DC

## 2015-09-09 MED ORDER — INFLUENZA VAC SPLIT QUAD 0.5 ML IM SUSY: 0.5000 mL | PREFILLED_SYRINGE | INTRAMUSCULAR | Status: DC

## 2015-09-09 MED ORDER — ACETAMINOPHEN 325 MG PO TABS
650.0000 mg | ORAL_TABLET | ORAL | Status: DC | PRN
Start: 2015-09-09 — End: 2017-09-30

## 2015-09-09 NOTE — Discharge Instructions (Signed)
Discharge instructions:   Call office if you have any of the following: headache, visual changes, fever >100 F, chills, breast concerns, excessive vaginal bleeding, incision drainage or problems, leg pain or redness, depression or any other concerns.   Activity: Do not lift > 10 lbs for 6 weeks.  No intercourse or tampons for 6 weeks.  No driving for 1-2 weeks.  Continue prenatal vitamin and iron.  Increase calories and fluids while breastfeeding.  For concerns about your baby call the pediatrician For concerns with breast feeding, please utilize the lactation consultant.

## 2015-09-09 NOTE — Progress Notes (Signed)
Patient understands all discharge instructions and the need to make follow up appointments. Patient discharge via wheelchair with RN. 

## 2015-11-26 ENCOUNTER — Ambulatory Visit
Admission: EM | Admit: 2015-11-26 | Discharge: 2015-11-26 | Disposition: A | Discharge status: Home / Self Care | Payer: BLUE CROSS/BLUE SHIELD | Attending: Family Medicine | Admitting: Family Medicine

## 2015-11-26 ENCOUNTER — Ambulatory Visit (INDEPENDENT_AMBULATORY_CARE_PROVIDER_SITE_OTHER): Payer: BLUE CROSS/BLUE SHIELD

## 2015-11-26 ENCOUNTER — Encounter: Payer: Self-pay | Admitting: Emergency Medicine

## 2015-11-26 DIAGNOSIS — M25531 Pain in right wrist: Secondary | ICD-10-CM

## 2015-11-26 LAB — PREGNANCY, URINE: PREG TEST UR: NEGATIVE

## 2015-11-26 MED ORDER — MELOXICAM 15 MG PO TABS: 15.0000 mg | ORAL_TABLET | Freq: Every day | ORAL | Status: DC | PRN

## 2015-11-26 MED ORDER — TRAMADOL HCL 50 MG PO TABS: 50.0000 mg | ORAL_TABLET | Freq: Three times a day (TID) | ORAL | Status: DC | PRN

## 2015-11-26 NOTE — ED Notes (Signed)
Pt reports fell several months ago onto right wrist and has had intermittent wrist pain and "popping" but then this morning went to push self up from lying and felt "pop" in right wrist and has had swelling and worsening pain. Pt reports unable to make fist with that hand

## 2015-11-26 NOTE — ED Provider Notes (Signed)
[facility]  ____________________________________________  Time seen: Approximately 1:22 PM  I have reviewed the triage vital signs and the nursing notes.   HISTORY  Chief Complaint Wrist Pain  HPI Tiffany Wiley is a 25 y.o. female presents for complaint of right wrist pain. Patient reports that approximately 4-5 months ago she tripped and caught herself with her right hand. Patient states that since then she has had intermittent right wrist pain. Patient states that the right wrist pain is not constant but intermittent. Patient reports that this morning when she was trying to get out of bed she pushed with her right wrist and had an increasing pain and states that she heard a "pop". Patient denies other fall. Denies head injury or loss consciousness. Denies other pain or injury. Denies pain radiation. Denies numbness or tingling sensation. Patient states the pain is primarily associated with right thumb movement.  Patient reports that she does have a 2.5 month baby, patient reports that she stopped breast-feeding 2 weeks ago. Patient states that she is not breast-feeding at all now. Patient reports that she has not yet had a menstrual postpartum. Reports that she is taking oral contraceptives.  States current right wrist pain is 5 out of 10 aching and worse with movement. Denies break in skin. Denies numbness or tingling sensation. States pain with active grips and thumb flexion. Denies other complaints.  PCP: Dr. Yetta Barre   Past Medical History  Diagnosis Date  . Medical history non-contributory     Patient Active Problem List   Diagnosis Date Noted  . Prolonged premature rupture of membranes 09/07/2015  . [redacted] weeks gestation of pregnancy 09/07/2015  . Indication for care in labor or delivery 09/07/2015  . Gestational hypertension w/o significant proteinuria in 3rd trimester 09/07/2015  . Supervision of normal first pregnancy 09/01/2015    Past Surgical History  Procedure  Laterality Date  . No past surgeries      Current Outpatient Rx  Name  Route  Sig  Dispense  Refill  .           .           .             Allergies Other and Shellfish allergy  No family history on file.  Social History Social History  Substance Use Topics  . Smoking status: Never Smoker   . Smokeless tobacco: Never Used  . Alcohol Use: No    Review of Systems Constitutional: No fever/chills Eyes: No visual changes. ENT: No sore throat. Cardiovascular: Denies chest pain. Respiratory: Denies shortness of breath. Gastrointestinal: No abdominal pain.  No nausea, no vomiting.  No diarrhea.  No constipation. Genitourinary: Negative for dysuria. Musculoskeletal: Negative for back pain. Right wrist pain. Skin: Negative for rash. Neurological: Negative for headaches, focal weakness or numbness.  10-point ROS otherwise negative.  ____________________________________________   PHYSICAL EXAM:  VITAL SIGNS: ED Triage Vitals  Enc Vitals Group     BP 11/26/15 1149 134/76 mmHg     Pulse Rate 11/26/15 1149 86     Resp 11/26/15 1149 16     Temp 11/26/15 1149 97.4 F (36.3 C)     Temp Source 11/26/15 1149 Tympanic     SpO2 11/26/15 1149 100 %     Weight 11/26/15 1149 130 lb (58.968 kg)     Height 11/26/15 1149  (1.6 m)     Head Cir --      Peak Flow --  Pain Score 11/26/15 1152 2     Pain Loc --      Pain Edu? --      Excl. in GC? --     Constitutional: Alert and oriented. Well appearing and in no acute distress. Eyes: Conjunctivae are normal. PERRL. EOMI. Head: Atraumatic.  Nose: No congestion/rhinnorhea.  Mouth/Throat: Mucous membranes are moist.   Neck: No stridor.  No cervical spine tenderness to palpation. Cardiovascular: Normal rate, regular rhythm. Grossly normal heart sounds.  Good peripheral circulation. Respiratory: Normal respiratory effort.  No retractions. Lungs CTAB. Gastrointestinal: Soft and nontender. Musculoskeletal: No lower or upper  extremity tenderness nor edema. . No cervical, thoracic or lumbar tenderness to palpation. Except: Right distal radius mild to mod TTP, right wrist with pain with resisted flexion and extension at distal radius; Sensation intact; no motor or tendon deficits, pain with thumb to fourth and fifth finger tip touch, cap refill < 2 seconds to all right fingers, negative tinnel's and phalen's.   Neurologic:  Normal speech and language. No gross focal neurologic deficits are appreciated. No gait instability. Skin:  Skin is warm, dry and intact. No rash noted. Psychiatric: Mood and affect are normal. Speech and behavior are normal.  ____________________________________________   LABS (all labs ordered are listed, but only abnormal results are displayed)  Labs Reviewed  PREGNANCY, URINE    RADIOLOGY  EXAM: RIGHT WRIST - COMPLETE 3+ VIEW  COMPARISON: None.  FINDINGS: There is no evidence of fracture or dislocation. There is no evidence of arthropathy or other focal bone abnormality. Soft tissues are unremarkable.  IMPRESSION: Negative.   Electronically Signed By: Elige Ko On: 11/26/2015 12:05  I, Renford Dills, personally viewed and evaluated these images (plain radiographs) as part of my medical decision making, as well as reviewing the written report by the radiologist.  ____________________________________________   PROCEDURES  Procedure(s) performed:   Right wrist thumb spica Velcro applied by RN. Neurovascular intact post application. ____________________________________________   INITIAL IMPRESSION / ASSESSMENT AND PLAN / ED COURSE  Pertinent labs & imaging results that were available during my care of the patient were reviewed by me and considered in my medical decision making (see chart for details).  Very well-appearing patient. No acute distress. Presents with spouse at bedside for the complaints of right wrist pain. Reports right wrist pain intermittently  times several months. Denies pain radiation. Reports right hand dominant. Pain right distal radius with pain with flexion in extension. sensation intact. no motor or tendon deficits. Will evaluate by x-ray.    Right wrist x-ray negative per radiologist. Suspect strain injury and suspect for right wrist tendinitis. Encouraged rest, ice, avoidance of aggravating triggers. Velcro thumb spica splint applied for support. Daily Mobic and when necessary tramadol. Information for orthopedic given and encouraged to follow-up for continued pain.    Discussed follow up with Primary care physician this week. Discussed follow up and return parameters including no resolution or any worsening concerns. Patient verbalized understanding and agreed to plan.   ____________________________________________   FINAL CLINICAL IMPRESSION(S) / ED DIAGNOSES  Final diagnoses:  Right wrist pain      Note: This dictation was prepared with Dragon dictation along with smaller phrase technology. Any transcriptional errors that result from this process are unintentional.    Renford Dills, NP 11/26/15 1537

## 2015-11-26 NOTE — Discharge Instructions (Signed)
Take medication as prescribed. Apply ice and elevate. Wear splint as long as pain continues.   Follow up with your primary care physician this week as needed. Follow up with orthopedic this week as needed for continued pain.   Return to Urgent care as needed for new or worsening concerns.   Wrist Pain There are many things that can cause wrist pain. Some common causes include:  An injury to the wrist area, such as a sprain, strain, or fracture.  Overuse of the joint.  A condition that causes increased pressure on a nerve in the wrist (carpal tunnel syndrome).  Wear and tear of the joints that occurs with aging (osteoarthritis).  A variety of other types of arthritis. Sometimes, the cause of wrist pain is not known. The pain often goes away when you follow your health care provider's instructions for relieving pain at home. If your wrist pain continues, tests may need to be done to diagnose your condition. HOME CARE INSTRUCTIONS Pay attention to any changes in your symptoms. Take these actions to help with your pain:  Rest the wrist area for at least 48 hours or as told by your health care provider.  If directed, apply ice to the injured area:  Put ice in a plastic bag.  Place a towel between your skin and the bag.  Leave the ice on for 20 minutes, 2-3 times per day.  Keep your arm raised (elevated) above the level of your heart while you are sitting or lying down.  If a splint or elastic bandage has been applied, use it as told by your health care provider.  Remove the splint or bandage only as told by your health care provider.  Loosen the splint or bandage if your fingers become numb or have a tingling feeling, or if they turn cold or blue.  Take over-the-counter and prescription medicines only as told by your health care provider.  Keep all follow-up visits as told by your health care provider. This is important. SEEK MEDICAL CARE IF:  Your pain is not helped by  treatment.  Your pain gets worse. SEEK IMMEDIATE MEDICAL CARE IF:  Your fingers become swollen.  Your fingers turn white, very red, or cold and blue.  Your fingers are numb or have a tingling feeling.  You have difficulty moving your fingers.   This information is not intended to replace advice given to you by your health care provider. Make sure you discuss any questions you have with your health care provider.   Document Released: 07/23/2005 Document Revised: 07/04/2015 Document Reviewed: 02/28/2015 Elsevier Interactive Patient Education [employer]

## 2017-02-19 ENCOUNTER — Other Ambulatory Visit: Payer: Self-pay

## 2017-02-23 ENCOUNTER — Other Ambulatory Visit: Payer: Self-pay

## 2017-02-23 MED ORDER — NORGESTIMATE-ETH ESTRADIOL 0.25-35 MG-MCG PO TABS: 1.0000 | ORAL_TABLET | Freq: Every day | ORAL | 0 refills | Status: DC

## 2017-09-21 ENCOUNTER — Ambulatory Visit: Payer: Self-pay | Admitting: Obstetrics and Gynecology

## 2017-09-30 ENCOUNTER — Encounter: Payer: Self-pay | Admitting: Obstetrics and Gynecology

## 2017-09-30 ENCOUNTER — Ambulatory Visit: Payer: Self-pay | Admitting: Obstetrics and Gynecology

## 2017-09-30 VITALS — BP 118/74 | Ht 63.0 in | Wt 144.0 lb

## 2017-09-30 DIAGNOSIS — Z01419 Encounter for gynecological examination (general) (routine) without abnormal findings: Secondary | ICD-10-CM

## 2017-09-30 DIAGNOSIS — F411 Generalized anxiety disorder: Secondary | ICD-10-CM

## 2017-09-30 DIAGNOSIS — Z124 Encounter for screening for malignant neoplasm of cervix: Secondary | ICD-10-CM

## 2017-09-30 DIAGNOSIS — Z1331 Encounter for screening for depression: Secondary | ICD-10-CM

## 2017-09-30 DIAGNOSIS — Z1339 Encounter for screening examination for other mental health and behavioral disorders: Secondary | ICD-10-CM

## 2017-09-30 HISTORY — DX: Generalized anxiety disorder: F41.1

## 2017-09-30 MED ORDER — CITALOPRAM HYDROBROMIDE 10 MG PO TABS: 10.0000 mg | ORAL_TABLET | Freq: Every day | ORAL | 1 refills | Status: DC

## 2017-09-30 NOTE — Patient Instructions (Signed)
Contraception Choices Contraception (birth control) is the use of any methods or devices to prevent pregnancy. Below are some methods to help avoid pregnancy. Hormonal methods  Contraceptive implant. This is a thin, plastic tube containing progesterone hormone. It does not contain estrogen hormone. Your health care provider inserts the tube in the inner part of the upper arm. The tube can remain in place for up to 3 years. After 3 years, the implant must be removed. The implant prevents the ovaries from releasing an egg (ovulation), thickens the cervical mucus to prevent sperm from entering the uterus, and thins the lining of the inside of the uterus.  Progesterone-only injections. These injections are given every 3 months by your health care provider to prevent pregnancy. This synthetic progesterone hormone stops the ovaries from releasing eggs. It also thickens cervical mucus and changes the uterine lining. This makes it harder for sperm to survive in the uterus.  Birth control pills. These pills contain estrogen and progesterone hormone. They work by preventing the ovaries from releasing eggs (ovulation). They also cause the cervical mucus to thicken, preventing the sperm from entering the uterus. Birth control pills are prescribed by a health care provider.Birth control pills can also be used to treat heavy periods.  Minipill. This type of birth control pill contains only the progesterone hormone. They are taken every day of each month and must be prescribed by your health care provider.  Birth control patch. The patch contains hormones similar to those in birth control pills. It must be changed once a week and is prescribed by a health care provider.  Vaginal ring. The ring contains hormones similar to those in birth control pills. It is left in the vagina for 3 weeks, removed for 1 week, and then a new one is put back in place. The patient must be comfortable inserting and removing the ring from  the vagina.A health care provider's prescription is necessary.  Emergency contraception. Emergency contraceptives prevent pregnancy after unprotected sexual intercourse. This pill can be taken right after sex or up to 5 days after unprotected sex. It is most effective the sooner you take the pills after having sexual intercourse. Most emergency contraceptive pills are available without a prescription. Check with your pharmacist. Do not use emergency contraception as your only form of birth control. Barrier methods  Female condom. This is a thin sheath (latex or rubber) that is worn over the penis during sexual intercourse. It can be used with spermicide to increase effectiveness.  Female condom. This is a soft, loose-fitting sheath that is put into the vagina before sexual intercourse.  Diaphragm. This is a soft, latex, dome-shaped barrier that must be fitted by a health care provider. It is inserted into the vagina, along with a spermicidal jelly. It is inserted before intercourse. The diaphragm should be left in the vagina for 6 to 8 hours after intercourse.  Cervical cap. This is a round, soft, latex or plastic cup that fits over the cervix and must be fitted by a health care provider. The cap can be left in place for up to 48 hours after intercourse.  Sponge. This is a soft, circular piece of polyurethane foam. The sponge has spermicide in it. It is inserted into the vagina after wetting it and before sexual intercourse.  Spermicides. These are chemicals that kill or block sperm from entering the cervix and uterus. They come in the form of creams, jellies, suppositories, foam, or tablets. They do not require a prescription. They   are inserted into the vagina with an applicator before having sexual intercourse. The process must be repeated every time you have sexual intercourse. Intrauterine contraception  Intrauterine device (IUD). This is a T-shaped device that is put in a woman's uterus during  a menstrual period to prevent pregnancy. There are 2 types: ? Copper IUD. This type of IUD is wrapped in copper wire and is placed inside the uterus. Copper makes the uterus and fallopian tubes produce a fluid that kills sperm. It can stay in place for 10 years. ? Hormone IUD. This type of IUD contains the hormone progestin (synthetic progesterone). The hormone thickens the cervical mucus and prevents sperm from entering the uterus, and it also thins the uterine lining to prevent implantation of a fertilized egg. The hormone can weaken or kill the sperm that get into the uterus. It can stay in place for 3-5 years, depending on which type of IUD is used. Permanent methods of contraception  Female tubal ligation. This is when the woman's fallopian tubes are surgically sealed, tied, or blocked to prevent the egg from traveling to the uterus.  Hysteroscopic sterilization. This involves placing a small coil or insert into each fallopian tube. Your doctor uses a technique called hysteroscopy to do the procedure. The device causes scar tissue to form. This results in permanent blockage of the fallopian tubes, so the sperm cannot fertilize the egg. It takes about 3 months after the procedure for the tubes to become blocked. You must use another form of birth control for these 3 months.  Female sterilization. This is when the female has the tubes that carry sperm tied off (vasectomy).This blocks sperm from entering the vagina during sexual intercourse. After the procedure, the man can still ejaculate fluid (semen). Natural planning methods  Natural family planning. This is not having sexual intercourse or using a barrier method (condom, diaphragm, cervical cap) on days the woman could become pregnant.  Calendar method. This is keeping track of the length of each menstrual cycle and identifying when you are fertile.  Ovulation method. This is avoiding sexual intercourse during ovulation.  Symptothermal method.  This is avoiding sexual intercourse during ovulation, using a thermometer and ovulation symptoms.  Post-ovulation method. This is timing sexual intercourse after you have ovulated. Regardless of which type or method of contraception you choose, it is important that you use condoms to protect against the transmission of sexually transmitted infections (STIs). Talk with your health care provider about which form of contraception is most appropriate for you. This information is not intended to replace advice given to you by your health care provider. Make sure you discuss any questions you have with your health care provider. Document Released: 10/13/2005 Document Revised: 03/20/2016 Document Reviewed: 04/07/2013 Elsevier Interactive Patient Education  2017 Elsevier Inc.  

## 2017-09-30 NOTE — Progress Notes (Signed)
Gynecology Annual Exam  PCP: Patient, No Pcp Per  Chief Complaint  Patient presents with  . Annual Exam   History of Present Illness:  [name] is an established patient,  26 y.o. G1P1001 who LMP was Patient's last menstrual period was 09/14/2017., presents today for her annual examination.  Her menses are regular every 28-30 days, lasting 5 day(s).  Dysmenorrhea none. She does not have intermenstrual bleeding.  She is single partner, contraception - none.  Last Pap: 09/05/14  Results were: no abnormalities /neg HPV DNA not done Hx of STDs: none  There is no FH of breast cancer. There is no FH of ovarian cancer. The patient does not do self-breast exams.  Tobacco use: The patient denies current or previous tobacco use. Alcohol use: social drinker Exercise: not active  The patient wears seatbelts: yes.   The patient reports that domestic violence in her life is absent.   Anxiety: Patient complains of high anxiety all the time.  She has the following symptoms: irritable, psychomotor agitation, racing thoughts. Onset of symptoms was approximately several years ago, gradually worsening since that time. She denies current suicidal and homicidal ideation. Family history significant for no psychiatric illness.Possible organic causes contributing are: none. Risk factors: none Previous treatment includes none. She has no history of hospitalization for psychiatric illness.  Past Medical History:  Diagnosis Date  . Medical history non-contributory   . Urinary tract infection     Past Surgical History:  Procedure Laterality Date  . NO PAST SURGERIES      Medications   Medication Sig Start Date End Date Taking? Authorizing Provider  prenatal vitamin w/FE, FA (PRENATAL 1 + 1) 27-1 MG TABS tablet Take 1 tablet by mouth daily at 12 noon.    [provider]    Allergies  Allergen Reactions  . Other Hives, Shortness Of Breath and Swelling    Blueberries  . Shellfish  Allergy Hives and Swelling   OB History  Gravida Para Term Preterm AB Living  1 1 1  0 0 1  SAB TAB Ectopic Multiple Live Births  0 0 0 0 1    # Outcome Date GA Lbr Len/2nd Weight Sex Delivery Anes PTL Lv  1 Term 09/08/15 [id number]  6 lb 11.9 oz (3.06 kg) M Vag-Spont EPI  LIV       Social History   Socioeconomic History  . Marital status: Married    Spouse name: Not on file  . Number of children: Not on file  . Years of education: Not on file  . Highest education level: Not on file  Social Needs  . Financial resource strain: Not on file  . Food insecurity - worry: Not on file  . Food insecurity - inability: Not on file  . Transportation needs - medical: Not on file  . Transportation needs - non-medical: Not on file  Occupational History  . Not on file  Tobacco Use  . Smoking status: Never Smoker  . Smokeless tobacco: Never Used  Substance and Sexual Activity  . Alcohol use: No  . Drug use: No  . Sexual activity: Yes    Birth control/protection: Condom  Other Topics Concern  . Not on file  Social History Narrative  . Not on file    Family History  Problem Relation Age of Onset  . Breast cancer Other    Review of Systems  Constitutional: Negative.   HENT: Negative.   Eyes: Negative.   Respiratory: Negative.  Cardiovascular: Negative.   Gastrointestinal: Negative.   Genitourinary: Negative.   Musculoskeletal: Negative.   Skin: Negative.   Neurological: Negative.   Psychiatric/Behavioral: Negative.      Physical Exam BP 118/74   Ht 5\' 3"  (1.6 m)   Wt 144 lb (65.3 kg)   LMP 09/14/2017   BMI 25.51 kg/m    Physical Exam  Constitutional: She is oriented to person, place, and time. She appears well-developed and well-nourished. No distress.  Genitourinary: Uterus normal. Pelvic exam was performed with patient supine. There is no rash, tenderness, lesion or injury on the right labia. There is no rash, tenderness, lesion or injury on the left labia. No  erythema, tenderness or bleeding in the vagina. No signs of injury around the vagina. No vaginal discharge found. Right adnexum does not display mass, does not display tenderness and does not display fullness. Left adnexum does not display mass, does not display tenderness and does not display fullness. Cervix does not exhibit motion tenderness, lesion, discharge or polyp.   Uterus is mobile and anteverted. Uterus is not enlarged, tender or exhibiting a mass.  HENT:  Head: Normocephalic and atraumatic.  Eyes: EOM are normal. No scleral icterus.  Neck: Normal range of motion. Neck supple. No thyromegaly present.  Cardiovascular: Normal rate and regular rhythm. Exam reveals no gallop and no friction rub.  No murmur heard. Pulmonary/Chest: Effort normal and breath sounds normal. No respiratory distress. She has no wheezes. She has no rales. Right breast exhibits no inverted nipple, no mass, no nipple discharge, no skin change and no tenderness. Left breast exhibits no inverted nipple, no mass, no nipple discharge, no skin change and no tenderness.  Abdominal: Soft. Bowel sounds are normal. She exhibits no distension and no mass. There is no tenderness. There is no rebound and no guarding.  Musculoskeletal: Normal range of motion. She exhibits no edema or tenderness.  Lymphadenopathy:    She has no cervical adenopathy.       Right: No inguinal adenopathy present.       Left: No inguinal adenopathy present.  Neurological: She is alert and oriented to person, place, and time. No cranial nerve deficit.  Skin: Skin is warm and dry. No rash noted. No erythema.  Psychiatric: She has a normal mood and affect. Her behavior is normal. Judgment normal.   Female chaperone present for pelvic and breast  portions of the physical exam  Results: AUDIT Questionnaire (screen for alcoholism): 3 PHQ-9: 17 (ZERO on #9)   Assessment: 26 y.o. 371P1001 female here for routine annual gynecologic  examination  Plan: Problem List Items Addressed This Visit    Generalized anxiety disorder   Relevant Medications   citalopram (CELEXA) 10 MG tablet    Other Visit Diagnoses    Women's annual routine gynecological examination    -  Primary   Relevant Orders   IGP, rfx Aptima HPV ASCU   Pap smear for cervical cancer screening       Relevant Orders   IGP, rfx Aptima HPV ASCU   Screening for depression       Screening for alcohol problem         Screening: -- Blood pressure screen normal -- Weight screening: normal -- Depression screening negative (PHQ-9) -- Nutrition: normal -- cholesterol screening: not due for screening -- osteoporosis screening: not due -- tobacco screening: not using -- alcohol screening: AUDIT questionnaire indicates low-risk usage. -- family history of breast cancer screening: done. not at high risk. --  no evidence of domestic violence or intimate partner violence. -- STD screening: gonorrhea/chlamydia NAAT not collected per patient request. -- pap smear collected per ASCCP guidelines -- flu vaccine will consider. Plans to get next visit.  -- HPV vaccination series: not eligilbe  Anxiety Disorder: Patient has a long history of anxiety. She states that she is a pretty happy person overall. However, over the years her anxiety has grown, especially since the delivery of her 58 year-old son.  She denies SI/HI.  Would like to be started on medication. I have recommended counseling. However, concurrent treatment with medication is reasonable.  Will have her follow up in one month to assess response to medication. Discussed potential side effects (nausea/vomiting, sexual side effects, mood effects, worsening of depression or anxiety, Si/HI).   Return in about 4 weeks (around 10/28/2017) for Medication follow up.   Thomasene Mohair, MD 09/30/2017 2:15 PM

## 2017-10-01 ENCOUNTER — Encounter: Payer: Self-pay | Admitting: Obstetrics and Gynecology

## 2017-10-01 LAB — IGP, RFX APTIMA HPV ASCU: PAP SMEAR COMMENT: 0

## 2017-10-28 ENCOUNTER — Ambulatory Visit: Payer: Self-pay | Admitting: Obstetrics and Gynecology

## 2017-11-10 ENCOUNTER — Ambulatory Visit (INDEPENDENT_AMBULATORY_CARE_PROVIDER_SITE_OTHER): Payer: Self-pay | Admitting: Obstetrics and Gynecology

## 2017-11-10 ENCOUNTER — Encounter: Payer: Self-pay | Admitting: Obstetrics and Gynecology

## 2017-11-10 VITALS — BP 118/74 | Wt 148.0 lb

## 2017-11-10 DIAGNOSIS — F411 Generalized anxiety disorder: Secondary | ICD-10-CM

## 2017-11-10 MED ORDER — CITALOPRAM HYDROBROMIDE 20 MG PO TABS: 20.0000 mg | ORAL_TABLET | Freq: Every day | ORAL | 3 refills | Status: DC

## 2017-11-10 NOTE — Progress Notes (Signed)
Obstetrics & Gynecology Office Visit   Chief Complaint  Patient presents with  . Follow-up  Anxiety and medication  History of Present Illness: 27 y.o. 591P1001 female presenting for follow up of anxiety and starting medication. Her PHQ-9 score one month ago was 17.  Today her score on the PHQ-9 is 5 and GAD-7 is 5. She has had no untoward side effects. Denies SI/HI and sexual side effects. State she got an initially good response, but feels like she is leveling off at a higher level of anxiety.  Would like to go up on the medication.    Past Medical History:  Diagnosis Date  . Medical history non-contributory   . Urinary tract infection     Past Surgical History:  Procedure Laterality Date  . NO PAST SURGERIES      Gynecologic History: No LMP recorded.  Obstetric History: G1P1001  Family History  Problem Relation Age of Onset  . Breast cancer Other     Social History   Socioeconomic History  . Marital status: Married    Spouse name: Not on file  . Number of children: Not on file  . Years of education: Not on file  . Highest education level: Not on file  Social Needs  . Financial resource strain: Not on file  . Food insecurity - worry: Not on file  . Food insecurity - inability: Not on file  . Transportation needs - medical: Not on file  . Transportation needs - non-medical: Not on file  Occupational History  . Not on file  Tobacco Use  . Smoking status: Never Smoker  . Smokeless tobacco: Never Used  Substance and Sexual Activity  . Alcohol use: No  . Drug use: No  . Sexual activity: Yes    Birth control/protection: Condom  Other Topics Concern  . Not on file  Social History Narrative  . Not on file    Allergies  Allergen Reactions  . Other Hives, Shortness Of Breath and Swelling    Blueberries  . Shellfish Allergy Hives and Swelling    Prior to Admission medications   Medication Sig Start Date End Date Taking? Authorizing Provider  citalopram  (CELEXA) 10 MG tablet Take 1 tablet (10 mg total) by mouth daily. 09/30/17   Conard NovakJackson, Arbutus Nelligan D, MD  prenatal vitamin w/FE, FA (PRENATAL 1 + 1) 27-1 MG TABS tablet Take 1 tablet by mouth daily at 12 noon.    [provider]    Review of Systems  Constitutional: Negative.   HENT: Negative.   Eyes: Negative.   Respiratory: Negative.   Cardiovascular: Negative.   Gastrointestinal: Negative.   Genitourinary: Negative.   Musculoskeletal: Negative.   Skin: Negative.   Neurological: Negative.   Psychiatric/Behavioral: Negative for depression, substance abuse and suicidal ideas. The patient is nervous/anxious. The patient does not have insomnia.      Physical Exam BP 118/74   Wt 148 lb (67.1 kg)   BMI 26.22 kg/m  No LMP recorded. Physical Exam  Constitutional: She is oriented to person, place, and time. She appears well-developed and well-nourished. No distress.  Eyes: EOM are normal. No scleral icterus.  Neck: Normal range of motion. Neck supple.  Cardiovascular: Normal rate and regular rhythm.  Pulmonary/Chest: Effort normal and breath sounds normal. No respiratory distress. She has no wheezes. She has no rales.  Abdominal: Soft. Bowel sounds are normal. She exhibits no distension and no mass. There is no tenderness. There is no rebound and no guarding.  Musculoskeletal: Normal range of motion. She exhibits no edema.  Neurological: She is alert and oriented to person, place, and time. No cranial nerve deficit.  Skin: Skin is warm and dry. No erythema.  Psychiatric: She has a normal mood and affect. Her behavior is normal. Judgment normal.   Assessment: 27 y.o. G46P1001 female here for  1. Generalized anxiety disorder      Plan: Problem List Items Addressed This Visit      Other   Generalized anxiety disorder - Primary   Relevant Medications   citalopram (CELEXA) 20 MG tablet    Increase dose of Celexa. Will monitor for any side effects.  Consider adding Buspirone, if  negative side effects arise.   Thomasene Mohair, MD 11/10/2017 1:42 PM

## 2017-12-16 ENCOUNTER — Other Ambulatory Visit: Payer: Self-pay

## 2017-12-16 ENCOUNTER — Encounter: Payer: Self-pay | Admitting: *Deleted

## 2017-12-16 ENCOUNTER — Ambulatory Visit
Admission: EM | Admit: 2017-12-16 | Discharge: 2017-12-16 | Disposition: A | Discharge status: Home / Self Care | Payer: BLUE CROSS/BLUE SHIELD | Attending: Emergency Medicine | Admitting: Emergency Medicine

## 2017-12-16 DIAGNOSIS — J0140 Acute pansinusitis, unspecified: Secondary | ICD-10-CM

## 2017-12-16 LAB — RAPID INFLUENZA A&B ANTIGENS: Influenza A (ARMC): NEGATIVE

## 2017-12-16 LAB — RAPID INFLUENZA A&B ANTIGENS (ARMC ONLY): INFLUENZA B (ARMC): NEGATIVE

## 2017-12-16 MED ORDER — METHOCARBAMOL 750 MG PO TABS: 750.0000 mg | ORAL_TABLET | ORAL | 0 refills | Status: DC

## 2017-12-16 MED ORDER — AMOXICILLIN-POT CLAVULANATE 875-125 MG PO TABS: 1.0000 | ORAL_TABLET | Freq: Two times a day (BID) | ORAL | 0 refills | Status: DC

## 2017-12-16 MED ORDER — FLUTICASONE PROPIONATE 50 MCG/ACT NA SUSP: 2.0000 | Freq: Every day | NASAL | 0 refills | Status: DC

## 2017-12-16 MED ORDER — IBUPROFEN 600 MG PO TABS: 600.0000 mg | ORAL_TABLET | Freq: Four times a day (QID) | ORAL | 0 refills | Status: DC | PRN

## 2017-12-16 NOTE — Discharge Instructions (Signed)
Take the medication as written. Start Mucinex-D to keep the mucous thin and to decongest you.  Return to the ER if you get worse, have a fever >100.4, or for any concerns. You may take 600 mg of motrin with 1 gram of tylenol up to 3-4 times a day as needed for pain. This is an effective combination for pain.   Use a NeilMed sinus rinse as often as you want to to reduce nasal congestion. Follow the directions on the box.  ° °Go to www.goodrx.com to look up your medications. This will give you a list of where you can find your prescriptions at the most affordable prices. Or you can ask the pharmacist what the cash price is. This is frequently cheaper than going through insurance.  °  °

## 2017-12-16 NOTE — ED Provider Notes (Signed)
HPI  SUBJECTIVE:  Tiffany Wiley is a 27 y.o. female who presents with 2-3 days of yellowish thick nasal congestion.  States that she got better yesterday and then acutely worse this morning.  She reports sinus pain and pressure, upper dental pain, postnasal drip, sore throat, cough productive of the same material as her nasal congestion.  She reports fevers T-max 101.6.  She also reports left ear pain, but no change in her hearing.  No body aches.  She also reports a posterior headache states that is going down both sides of her neck.  States that her child is sick with similar symptoms, but he tested negative for flu.  She tried Sudafed 2 days ago with improvement in her symptoms.  No aggravating factors.  No wheezing, chest pain, shortness of breath, dyspnea on exertion.  No neck stiffness, rash, photophobia.  No abdominal pain, urinary complaints.  States that she is sleeping well at night.  No antibiotics in the past month.  No antipyretic in the past 6-8 hours.  Past medical history negative for diabetes, hypertension, immunocompromise, asthma.  LMP: 1/28.  Denies the possibility of being pregnant.  PMD: She sees her OB/GYN.  Past Medical History:  Diagnosis Date  . Medical history non-contributory   . Urinary tract infection     Past Surgical History:  Procedure Laterality Date  . NO PAST SURGERIES      Family History  Problem Relation Age of Onset  . Breast cancer Other   . Healthy Mother     Social History   Tobacco Use  . Smoking status: Never Smoker  . Smokeless tobacco: Never Used  Substance Use Topics  . Alcohol use: No  . Drug use: No    No current facility-administered medications for this encounter.   Current Outpatient Medications:  .  citalopram (CELEXA) 20 MG tablet, Take 1 tablet (20 mg total) by mouth daily., Disp: 30 tablet, Rfl: 3 .  amoxicillin-clavulanate (AUGMENTIN) 875-125 MG tablet, Take 1 tablet by mouth 2 (two) times daily. X 7 days, Disp: 14 tablet,  Rfl: 0 .  fluticasone (FLONASE) 50 MCG/ACT nasal spray, Place 2 sprays into both nostrils daily., Disp: 16 g, Rfl: 0 .  ibuprofen (ADVIL,MOTRIN) 600 MG tablet, Take 1 tablet (600 mg total) by mouth every 6 (six) hours as needed., Disp: 30 tablet, Rfl: 0 .  methocarbamol (ROBAXIN) 750 MG tablet, Take 1 tablet (750 mg total) by mouth every 4 (four) hours., Disp: 40 tablet, Rfl: 0  Allergies  Allergen Reactions  . Other Hives, Shortness Of Breath and Swelling    Blueberries  . Shellfish Allergy Hives and Swelling     ROS  As noted in HPI.   Physical Exam  BP 121/71 (BP Location: Left Arm)   Pulse (!) 113   Temp 100.3 F (37.9 C) (Oral)   Resp 16   Ht 5\' 3"  (1.6 m)   Wt 145 lb (65.8 kg)   LMP 11/23/2017   SpO2 98%   BMI 25.69 kg/m   Constitutional: Well developed, well nourished, no acute distress Eyes:  EOMI, conjunctiva normal bilaterally HENT: Normocephalic, atraumatic,mucus membranes moist. TMs normal bilaterally. + purulent nasal congestion. Swollen red turbinates. + maxillary sinus tenderness, +  frontal sinus tenderness. Oropharynx normal  + postnasal drip. Neck: Positive cervical lymphadenopathy.  No meningismus.  Positive bilateral trapezial tenderness to the insertion of the occiput, mild muscle spasm. Respiratory: Normal inspiratory effort, lungs clear bilaterally cardiovascular: Regular tachycardia, no murmurs, rubs, gallops GI:  nondistended skin: No rash, skin intact Musculoskeletal: no deformities Neurologic: Alert & oriented x 3, no focal neuro deficits Psychiatric: Speech and behavior appropriate   ED Course   Medications - No data to display  Orders Placed This Encounter  Procedures  . Rapid Influenza A&B Antigens ([facility] only)    Standing Status:   Standing    Number of Occurrences:   1  . Droplet precaution    Standing Status:   Standing    Number of Occurrences:   1    Results for orders placed or performed during the hospital encounter of  12/16/17 (from the past 24 hour(s))  Rapid Influenza A&B Antigens ([facility] only)     Status: None   Collection Time: 12/16/17 10:19 AM  Result Value Ref Range   Influenza A ([facility]) NEGATIVE NEGATIVE   Influenza B ([facility]) NEGATIVE NEGATIVE   No results found.  ED Clinical Impression  Acute non-recurrent pansinusitis  ED Assessment/Plan  Presentation consistent with a sinusitis rather than influenza. Flu negative.  Given the fact that she had double sickening, feel that is reasonable to send home with Augmentin in addition to Flonase, saline nasal irrigation, Mucinex D, ibuprofen 600 mg to take with 1 g of Tylenol 3-4 times a day as needed for pain.  Also Robaxin for the bilateral trapezial muscle spasm/tenderness which I think is contributing to her posterior headache.  No evidence of meningitis.  Follow-up with her primary care physician as needed, to the ER if she gets worse.  Discussed MDM and plan with pt. Discussed sn/sx that should prompt return to the ED. Pt agrees with plan.  *This clinic note was created using Dragon dictation software. Therefore, there may be occasional mistakes despite careful proofreading.  ?    Domenick GongMortenson, Savannaha Stonerock, MD 12/16/17 1118

## 2017-12-16 NOTE — ED Triage Notes (Signed)
Patient started having symptoms of fever, cough nasal congestion, and headache this AM.

## 2018-03-04 ENCOUNTER — Other Ambulatory Visit: Payer: Self-pay | Admitting: Obstetrics and Gynecology

## 2018-03-04 DIAGNOSIS — F411 Generalized anxiety disorder: Secondary | ICD-10-CM

## 2018-03-04 MED ORDER — CITALOPRAM HYDROBROMIDE 20 MG PO TABS: 20.0000 mg | ORAL_TABLET | Freq: Every day | ORAL | 1 refills | Status: DC

## 2018-03-16 ENCOUNTER — Ambulatory Visit
Admission: EM | Admit: 2018-03-16 | Discharge: 2018-03-16 | Disposition: A | Discharge status: Home / Self Care | Payer: Self-pay | Attending: Family Medicine | Admitting: Family Medicine

## 2018-03-16 ENCOUNTER — Other Ambulatory Visit: Payer: Self-pay

## 2018-03-16 ENCOUNTER — Ambulatory Visit (INDEPENDENT_AMBULATORY_CARE_PROVIDER_SITE_OTHER)
Admit: 2018-03-16 | Discharge: 2018-03-16 | Disposition: A | Discharge status: Home / Self Care | Payer: Self-pay | Attending: Family Medicine | Admitting: Family Medicine

## 2018-03-16 ENCOUNTER — Encounter: Payer: Self-pay | Admitting: Emergency Medicine

## 2018-03-16 DIAGNOSIS — S0993XA Unspecified injury of face, initial encounter: Secondary | ICD-10-CM

## 2018-03-16 MED ORDER — DICLOFENAC SODIUM 75 MG PO TBEC: 75.0000 mg | DELAYED_RELEASE_TABLET | Freq: Two times a day (BID) | ORAL | 0 refills | Status: DC | PRN
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
## 2018-03-16 NOTE — Discharge Instructions (Signed)
Medication as prescribed.  Take care  Dr. Toye Rouillard  
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
## 2018-05-04 ENCOUNTER — Other Ambulatory Visit: Payer: Self-pay | Admitting: Obstetrics and Gynecology

## 2018-05-04 ENCOUNTER — Other Ambulatory Visit (HOSPITAL_COMMUNITY)
Admission: RE | Admit: 2018-05-04 | Discharge: 2018-05-04 | Disposition: A | Discharge status: Home / Self Care | Payer: Self-pay | Source: Ambulatory Visit | Attending: Obstetrics and Gynecology | Admitting: Obstetrics and Gynecology

## 2018-05-04 ENCOUNTER — Encounter: Payer: Self-pay | Admitting: Obstetrics and Gynecology

## 2018-05-04 ENCOUNTER — Ambulatory Visit (INDEPENDENT_AMBULATORY_CARE_PROVIDER_SITE_OTHER): Payer: Self-pay

## 2018-05-04 ENCOUNTER — Ambulatory Visit (INDEPENDENT_AMBULATORY_CARE_PROVIDER_SITE_OTHER): Payer: Self-pay | Admitting: Obstetrics and Gynecology

## 2018-05-04 VITALS — BP 120/80 | Wt 164.0 lb

## 2018-05-04 DIAGNOSIS — O3680X0 Pregnancy with inconclusive fetal viability, not applicable or unspecified: Secondary | ICD-10-CM

## 2018-05-04 DIAGNOSIS — O209 Hemorrhage in early pregnancy, unspecified: Secondary | ICD-10-CM

## 2018-05-04 DIAGNOSIS — N8312 Corpus luteum cyst of left ovary: Secondary | ICD-10-CM

## 2018-05-04 DIAGNOSIS — Z3A01 Less than 8 weeks gestation of pregnancy: Secondary | ICD-10-CM

## 2018-05-04 DIAGNOSIS — Z3480 Encounter for supervision of other normal pregnancy, unspecified trimester: Secondary | ICD-10-CM

## 2018-05-04 DIAGNOSIS — O0990 Supervision of high risk pregnancy, unspecified, unspecified trimester: Secondary | ICD-10-CM | POA: Insufficient documentation

## 2018-05-04 DIAGNOSIS — O3411 Maternal care for benign tumor of corpus uteri, first trimester: Secondary | ICD-10-CM

## 2018-05-04 NOTE — Progress Notes (Signed)
New Obstetric Patient H&P   Chief Complaint: "Desires prenatal care"  History of Present Illness: Patient is a 27 y.o. G2P1001 Not Hispanic or Latino female, unsure LMP 03/05/18 presents with amenorrhea and positive home pregnancy test. Based on her  LMP, her EDD is Estimated Date of Delivery: 12/10/18 and her EGA is [redacted]w[redacted]d. Cycles are 6. days, regular, and occur approximately every : 28 days. Her last pap smear was 7 years ago and was no abnormalities.    She had a urine pregnancy test which was positive 2 or 3 week(s)  ago. Her last menstrual period was normal and lasted for  6 day(s). Since her LMP she claims she has experienced no issues. She noted bright red vaginal bleeding last night (at first kind of heavy, none since then). She had some brown spotting a few weeks ago. Her past medical history is noncontributory. Her prior pregnancies are notable for first trimester bleeding.  Since her LMP, she admits to the use of tobacco products  no She claims she has gained zero pounds since the start of her pregnancy.  There are cats in the home in the home no  She admits close contact with children on a regular basis  yes  She has had chicken pox in the past no She has had Tuberculosis exposures, symptoms, or previously tested positive for TB   no Current or past history of domestic violence. no  Genetic Screening/Teratology Counseling: (Includes patient, baby's father, or anyone in either family with:)   1. Patient's age >/= 24 [facility]  no 2. Thalassemia ([place], [place], Mediterranean, or Asian background): MCV<80  no 3. Neural tube defect (meningomyelocele, spina bifida, anencephaly)  no 4. Congenital heart defect  no  5. Down syndrome  no 6. Tay-Sachs (Jewish, [place]  no 7. Canavan's Disease  no 8. Sickle cell disease or trait (African)  no  9. Hemophilia or other blood disorders  no  10. Muscular dystrophy  no  11. Cystic fibrosis  no  12. Huntington's Chorea  no  13. Mental  retardation/autism  no 14. Other inherited genetic or chromosomal disorder  no 15. Maternal metabolic disorder (DM, PKU, etc)  no 16. Patient or FOB with a child with a birth defect not listed above no  16a. Patient or FOB with a birth defect themselves no 17. Recurrent pregnancy loss, or stillbirth  no  18. Any medications since LMP other than prenatal vitamins (include vitamins, supplements, OTC meds, drugs, alcohol)  no 19. Any other genetic/environmental exposure to discuss  no  Infection History:   1. Lives with someone with TB or TB exposed  no  2. Patient or partner has history of genital herpes  no 3. Rash or viral illness since LMP  no 4. History of STI (GC, CT, HPV, syphilis, HIV)  no 5. History of recent travel :  no  Other pertinent information: She was taking Celexa up until about 1-1.5 weeks ago. No residual symptoms today.   Review of Systems:10 point review of systems negative unless otherwise noted in HPI  Past Medical History:  Diagnosis Date  . Medical history non-contributory   . Urinary tract infection     Past Surgical History:  Procedure Laterality Date  . NO PAST SURGERIES     Gynecologic History: Patient's last menstrual period was 03/05/2018.  Obstetric History: G2P1001, s/p SVD, weight 6 lb 12 oz.   Family History  Problem Relation Age of Onset  . Breast cancer Other   .  Healthy Mother     Social History   Socioeconomic History  . Marital status: Married    Spouse name: Not on file  . Number of children: Not on file  . Years of education: Not on file  . Highest education level: Not on file  Occupational History  . Not on file  Social Needs  . Financial resource strain: Not on file  . Food insecurity:    Worry: Not on file    Inability: Not on file  . Transportation needs:    Medical: Not on file    Non-medical: Not on file  Tobacco Use  . Smoking status: Never Smoker  . Smokeless tobacco: Never Used  Substance and Sexual  Activity  . Alcohol use: No  . Drug use: No  . Sexual activity: Yes    Birth control/protection: Condom  Lifestyle  . Physical activity:    Days per week: Not on file    Minutes per session: Not on file  . Stress: Not on file  Relationships  . Social connections:    Talks on phone: Not on file    Gets together: Not on file    Attends religious service: Not on file    Active member of club or organization: Not on file    Attends meetings of clubs or organizations: Not on file    Relationship status: Not on file  . Intimate partner violence:    Fear of current or ex partner: Not on file    Emotionally abused: Not on file    Physically abused: Not on file    Forced sexual activity: Not on file  Other Topics Concern  . Not on file  Social History Narrative  . Not on file    Allergies  Allergen Reactions  . Other Hives, Shortness Of Breath and Swelling    Blueberries  . Shellfish Allergy Hives and Swelling    Prior to Admission medications   Medication Sig Start Date End Date Taking? Authorizing Provider  Prenatal Vit-Fe Fumarate-FA (PRENATAL MULTIVITAMIN) TABS tablet Take 1 tablet by mouth daily at 12 noon.   Yes [provider]    Physical Exam BP 120/80   Wt 164 lb (74.4 kg)   LMP 03/05/2018   BMI 29.05 kg/m   Physical Exam  Constitutional: She is oriented to person, place, and time. She appears well-developed and well-nourished. No distress.  HENT:  Head: Normocephalic and atraumatic.  Eyes: Conjunctivae are normal.  Neck: Normal range of motion. Neck supple. No thyromegaly present.  Cardiovascular: Normal rate, regular rhythm and normal heart sounds. Exam reveals no gallop and no friction rub.  No murmur heard. Pulmonary/Chest: Effort normal and breath sounds normal. She has no wheezes.  Abdominal: Soft. She exhibits no distension and no mass. There is no tenderness. There is no rebound and no guarding. No hernia. Hernia confirmed negative in the right  inguinal area and confirmed negative in the left inguinal area.  Genitourinary: Pelvic exam was performed with patient supine. There is no rash, tenderness or lesion on the right labia. There is no rash, tenderness or lesion on the left labia.  Musculoskeletal: Normal range of motion.  Lymphadenopathy:       Right: No inguinal adenopathy present.       Left: No inguinal adenopathy present.  Neurological: She is alert and oriented to person, place, and time.  Skin: Skin is warm and dry. No rash noted.  Psychiatric: She has a normal mood and affect.  Her behavior is normal. Judgment normal.    US Ob Transvaginal  Result Date: 05/04/2018 ULTRASOUND REPORT Location: [facility] Date of Service: 05/04/2018 Patient Name: Caylie Sandquist DOB: [birth year] MRN: [id number] Indications:dating Findings: Mason Jim intrauterine pregnancy is visualized. Fetal pole is not seen. Yolk sac is seen Gestational sac is too small to calculate the gestational age. Yolk sac is visualized and appears normal and early anatomy is normal. Amnion: not visualized Right Ovary is normal in appearance. Left Ovary is normal appearance. Corpus luteal cyst:  Left ovary Survey of the adnexa demonstrates no adnexal masses. There is no free peritoneal fluid in the cul de sac. Impression: 1. Fetal pole is not seen.  Yolk sac noted. 2. Gestational sac is too small to calculate a gestational age. Recommendations: 1.Clinical correlation with the patient's History and Physical Exam. 2. Follow up in 11 days or more to verify viability*. Mital bahen Leodis Binet, RDMS *Criteria from the Society of Radiologists in Ultrasound Multispecialty Consensus Conference on Early First Trimester Diagnosis of Miscarriage and Exclusion of a Viable Intrauterine Pregnancy, October 2012. There is a single gestational sac noted within the uterus.  Detailed evaluation of the fetal anatomy is precluded by early gestational age.  It must be noted that a normal ultrasound particular  at this early gestational age is unable to rule out fetal aneuploidy, risk of first trimester miscarriage, or anatomic birth defects. Thomasene Mohair, MD, [facility], [facility] 05/04/2018 5:25 PM    Female Chaperone present during breast and/or pelvic exam.   Assessment: 27 y.o. G2P1001 at [redacted]w[redacted]d presenting to initiate prenatal care  Plan: 1) Avoid alcoholic beverages. 2) Patient encouraged not to smoke.  3) Discontinue the use of all non-medicinal drugs and chemicals.  4) Take prenatal vitamins daily.  5) Nutrition, food safety (fish, cheese advisories, and high nitrite foods) and exercise discussed. 6) Hospital and practice style discussed with cross coverage system.  7) Genetic Screening, such as with 1st Trimester Screening, cell free fetal DNA, AFP testing, and Ultrasound, as well as with amniocentesis and CVS as appropriate, is discussed with patient. At the conclusion of today's visit patient declined genetic testing 8) Patient is asked about travel to areas at risk for the [place] virus, and counseled to avoid travel and exposure to mosquitoes or sexual partners who may have themselves been exposed to the virus. Testing is discussed, and will be ordered as appropriate.  9) Ultrasound shows gestational sac with yolk sac with gestational sac too small to calculate gestational age.  Follow up in 11 days, as noted in the ultrasound report.   Thomasene Mohair, MD 05/04/2018 2:36 PM

## 2018-05-05 LAB — CERVICOVAGINAL ANCILLARY ONLY
CHLAMYDIA, DNA PROBE: NEGATIVE
Neisseria Gonorrhea: NEGATIVE

## 2018-05-06 LAB — URINE CULTURE

## 2018-05-14 ENCOUNTER — Encounter: Payer: Self-pay | Admitting: Obstetrics and Gynecology

## 2018-05-14 ENCOUNTER — Other Ambulatory Visit: Payer: Self-pay

## 2018-05-15 ENCOUNTER — Other Ambulatory Visit: Payer: Self-pay | Admitting: Obstetrics and Gynecology

## 2018-05-15 DIAGNOSIS — O219 Vomiting of pregnancy, unspecified: Secondary | ICD-10-CM | POA: Insufficient documentation

## 2018-05-15 DIAGNOSIS — Z3480 Encounter for supervision of other normal pregnancy, unspecified trimester: Secondary | ICD-10-CM

## 2018-05-15 MED ORDER — DOXYLAMINE-PYRIDOXINE ER 20-20 MG PO TBCR: 1.0000 | EXTENDED_RELEASE_TABLET | Freq: Two times a day (BID) | ORAL | 3 refills | Status: DC

## 2018-05-17 ENCOUNTER — Ambulatory Visit (INDEPENDENT_AMBULATORY_CARE_PROVIDER_SITE_OTHER): Payer: Self-pay | Admitting: Obstetrics and Gynecology

## 2018-05-17 ENCOUNTER — Ambulatory Visit (INDEPENDENT_AMBULATORY_CARE_PROVIDER_SITE_OTHER): Payer: Self-pay

## 2018-05-17 ENCOUNTER — Encounter: Payer: Self-pay | Admitting: Obstetrics and Gynecology

## 2018-05-17 VITALS — BP 110/58 | Wt 165.0 lb

## 2018-05-17 DIAGNOSIS — Z3480 Encounter for supervision of other normal pregnancy, unspecified trimester: Secondary | ICD-10-CM

## 2018-05-17 DIAGNOSIS — O219 Vomiting of pregnancy, unspecified: Secondary | ICD-10-CM

## 2018-05-17 DIAGNOSIS — Z3A01 Less than 8 weeks gestation of pregnancy: Secondary | ICD-10-CM

## 2018-05-17 DIAGNOSIS — O209 Hemorrhage in early pregnancy, unspecified: Secondary | ICD-10-CM

## 2018-05-17 DIAGNOSIS — F411 Generalized anxiety disorder: Secondary | ICD-10-CM

## 2018-05-17 DIAGNOSIS — O99341 Other mental disorders complicating pregnancy, first trimester: Secondary | ICD-10-CM

## 2018-05-17 NOTE — Progress Notes (Incomplete)
  Routine Prenatal Care Visit  Subjective  Tiffany LoaderStacie Wiley is a 27 y.o. G2P1001 at 9655w2d being seen today for ongoing prenatal care.  She is currently monitored for the following issues for this {Blank single:19197::"high-risk","low-risk"} pregnancy and has Generalized anxiety disorder; Supervision of other normal pregnancy, antepartum; and Nausea/vomiting in pregnancy on their problem list.  ----------------------------------------------------------------------------------- Patient reports {sx:14538}.    . Vag. Bleeding: None.   . Denies leaking of fluid.  ----------------------------------------------------------------------------------- The following portions of the patient's history were reviewed and updated as appropriate: allergies, current medications, past family history, past medical history, past social history, past surgical history and problem list. Problem list updated.   Objective  Blood pressure (!) 110/58, weight 165 lb (74.8 kg), last menstrual period 03/05/2018. Pregravid weight 160 lb (72.6 kg) Total Weight Gain 5 lb (2.268 kg) Urinalysis: Urine Protein: Negative Urine Glucose: Negative  Fetal Status: Fetal Heart Rate (bpm): present         General:  Alert, oriented and cooperative. Patient is in no acute distress.  Skin: Skin is warm and dry. No rash noted.   Cardiovascular: Normal heart rate noted  Respiratory: Normal respiratory effort, no problems with respiration noted  Abdomen: Soft, gravid, appropriate for gestational age.       Pelvic:  {Blank single:19197::"Cervical exam performed","Cervical exam deferred"}        Extremities: Normal range of motion.     Mental Status: Normal mood and affect. Normal behavior. Normal judgment and thought content.   Assessment   27 y.o. G2P1001 at 6455w2d by  01/01/2019, by Ultrasound presenting for {Blank single:19197::"routine","work-in"} prenatal visit  Plan   Pregnancy # 2 Problems (from 03/05/18 to present)    Problem Noted  Resolved   Nausea/vomiting in pregnancy 05/15/2018 by Conard NovakJackson, Stephen D, MD No   Supervision of other normal pregnancy, antepartum 05/04/2018 by Conard NovakJackson, Stephen D, MD No       {Blank single:19197::"Term","Preterm"} labor symptoms and general obstetric precautions including but not limited to vaginal bleeding, contractions, leaking of fluid and fetal movement were reviewed in detail with the patient. Please refer to After Visit Summary for other counseling recommendations.   Return in about 1 month (around 06/14/2018) for Routine Prenatal Appointment.  Thomasene MohairStephen Jackson, MD, [facility]linda FrederickFACOG [facility], [facility] 05/17/2018 5:23 PM

## 2018-05-17 NOTE — Progress Notes (Signed)
Routine Prenatal Care Visit  Subjective  Tiffany Wiley is a 27 y.o. G2P1001 at 9320w2d being seen today for ongoing prenatal care.  She is currently monitored for the following issues for this low-risk pregnancy and has Generalized anxiety disorder; Supervision of other normal pregnancy, antepartum; and Nausea/vomiting in pregnancy on their problem list.  ----------------------------------------------------------------------------------- Patient reports no complaints.    . Vag. Bleeding: None.   . Denies leaking of fluid.  EDD changed based on today's u/s. SLIUP at 5020w2d with + cardiac activity.  ----------------------------------------------------------------------------------- The following portions of the patient's history were reviewed and updated as appropriate: allergies, current medications, past family history, past medical history, past social history, past surgical history and problem list. Problem list updated.   Objective  Blood pressure (!) 110/58, weight 165 lb (74.8 kg), last menstrual period 03/05/2018. Pregravid weight 160 lb (72.6 kg) Total Weight Gain 5 lb (2.268 kg) Urinalysis: Urine Protein: Negative Urine Glucose: Negative  Fetal Status: Fetal Heart Rate (bpm): present         General:  Alert, oriented and cooperative. Patient is in no acute distress.  Skin: Skin is warm and dry. No rash noted.   Cardiovascular: Normal heart rate noted  Respiratory: Normal respiratory effort, no problems with respiration noted  Abdomen: Soft, gravid, appropriate for gestational age.       Pelvic:  Cervical exam deferred        Extremities: Normal range of motion.     Mental Status: Normal mood and affect. Normal behavior. Normal judgment and thought content.   [place] Ob Comp Less 14 Wks  Result Date: 05/17/2018 ULTRASOUND REPORT Patient Name: Tiffany LoaderStacie Wiley DOB: 03/15/1991 MRN: [id number] Location: [facility] Date of Service: 05/17/2018 Indications:dating Findings: Mason JimSingleton intrauterine  pregnancy is visualized with a CRL consistent with 5320w2d gestation, giving an (U/S) EDD of 01/01/2019. The (U/S) EDD is not consistent with the clinically established EDD of 12/10/2018. FHR: 157 bpm CRL measurement: 11.1 mm Yolk sac is visualized and appears normal and early anatomy is normal. Amnion: not visualized Right Ovary is normal in appearance. Left Ovary is normal appearance. Corpus luteal cyst:  is not visualized Survey of the adnexa demonstrates no adnexal masses. There is no free peritoneal fluid in the cul de sac. Impression: 1. 9220w2d Viable Singleton Intrauterine pregnancy by U/S. 2. (U/S) EDD is not consistent with Clinically established EDD of 12/10/2018. 3. EDD is now 01/01/2019 based on today's ultrasound. Recommendations: 1.Clinical correlation with the patient's History and Physical Exam. Willette AlmaKristen Priestley, RDMS, RVT There is a viable singleton gestation.  Detailed evaluation of the fetal anatomy is precluded by early gestational age.  It must be noted that a normal ultrasound particular at this early gestational age is unable to rule out fetal aneuploidy, risk of first trimester miscarriage, or anatomic birth defects. Thomasene MohairStephen Lupie Sawa, MD, Merlinda FrederickFACOG [facility], [facility] 05/17/2018 4:25 PM      Assessment   27 y.o. G2P1001 at 6920w2d by  01/01/2019, by Ultrasound presenting for routine prenatal visit  Plan   Pregnancy # 2 Problems (from 03/05/18 to present)    Problem Noted Resolved   Nausea/vomiting in pregnancy 05/15/2018 by Conard NovakJackson, Baylei Siebels D, MD No   Supervision of other normal pregnancy, antepartum 05/04/2018 by Conard NovakJackson, Shirlean Berman D, MD No       Preterm labor symptoms and general obstetric precautions including but not limited to vaginal bleeding, contractions, leaking of fluid and fetal movement were reviewed in detail with the patient. Please refer to After  Visit Summary for other counseling recommendations.   Return in about 1 month (around 06/14/2018) for Routine  Prenatal Appointment.  Thomasene Mohair, MD, [facility], [facility] 05/17/2018 5:23 PM

## 2018-06-14 ENCOUNTER — Encounter: Payer: Self-pay | Admitting: Obstetrics and Gynecology

## 2018-06-14 ENCOUNTER — Ambulatory Visit (INDEPENDENT_AMBULATORY_CARE_PROVIDER_SITE_OTHER): Payer: Self-pay | Admitting: Obstetrics and Gynecology

## 2018-06-14 VITALS — BP 108/68 | Wt 158.0 lb

## 2018-06-14 DIAGNOSIS — F411 Generalized anxiety disorder: Secondary | ICD-10-CM

## 2018-06-14 DIAGNOSIS — Z1379 Encounter for other screening for genetic and chromosomal anomalies: Secondary | ICD-10-CM

## 2018-06-14 DIAGNOSIS — O219 Vomiting of pregnancy, unspecified: Secondary | ICD-10-CM

## 2018-06-14 DIAGNOSIS — O99341 Other mental disorders complicating pregnancy, first trimester: Secondary | ICD-10-CM

## 2018-06-14 DIAGNOSIS — Z3480 Encounter for supervision of other normal pregnancy, unspecified trimester: Secondary | ICD-10-CM

## 2018-06-14 DIAGNOSIS — Z3A11 11 weeks gestation of pregnancy: Secondary | ICD-10-CM

## 2018-06-14 LAB — POCT URINALYSIS DIPSTICK OB: Glucose, UA: NEGATIVE — AB

## 2018-06-14 NOTE — Progress Notes (Signed)
  Routine Prenatal Care Visit  Subjective  Tiffany LoaderStacie Wiley is a 27 y.o. G2P1001 at 3637w2d being seen today for ongoing prenatal care.  She is currently monitored for the following issues for this low-risk pregnancy and has Generalized anxiety disorder; Supervision of other normal pregnancy, antepartum; and Nausea/vomiting in pregnancy on their problem list.  ----------------------------------------------------------------------------------- Patient reports no complaints.   Contractions: Not present. Vag. Bleeding: None.  Movement: Absent. Denies leaking of fluid.  ----------------------------------------------------------------------------------- The following portions of the patient's history were reviewed and updated as appropriate: allergies, current medications, past family history, past medical history, past social history, past surgical history and problem list. Problem list updated.   Objective  Blood pressure 108/68, weight 158 lb (71.7 kg), last menstrual period 03/05/2018. Pregravid weight 160 lb (72.6 kg) Total Weight Gain -2 lb (-0.907 kg) Urinalysis:      Fetal Status: Fetal Heart Rate (bpm): 160   Movement: Absent     General:  Alert, oriented and cooperative. Patient is in no acute distress.  Skin: Skin is warm and dry. No rash noted.   Cardiovascular: Normal heart rate noted  Respiratory: Normal respiratory effort, no problems with respiration noted  Abdomen: Soft, gravid, appropriate for gestational age.       Pelvic:  Cervical exam deferred        Extremities: Normal range of motion.     Mental Status: Normal mood and affect. Normal behavior. Normal judgment and thought content.   Assessment   27 y.o. G2P1001 at 2537w2d by  01/01/2019, by Ultrasound presenting for routine prenatal visit  Plan   Pregnancy # 2 Problems (from 03/05/18 to present)    Problem Noted Resolved   Nausea/vomiting in pregnancy 05/15/2018 by Conard NovakJackson, Ethaniel Garfield D, MD No   Supervision of other normal  pregnancy, antepartum 05/04/2018 by Conard NovakJackson, Onetta Spainhower D, MD No       Preterm labor symptoms and general obstetric precautions including but not limited to vaginal bleeding, contractions, leaking of fluid and fetal movement were reviewed in detail with the patient. Please refer to After Visit Summary for other counseling recommendations.   - patient would like to get NIPT testing. Awaiting medicaid approval, then we will get labs. Order placed today.   Return in about 4 weeks (around 07/12/2018) for Routine Prenatal Appointment.  Thomasene MohairStephen Alek Borges, MD, Merlinda FrederickFACOG [facility], [facility] 06/14/2018 5:54 PM

## 2018-06-15 ENCOUNTER — Other Ambulatory Visit: Payer: Self-pay | Admitting: Obstetrics and Gynecology

## 2018-06-15 ENCOUNTER — Other Ambulatory Visit: Payer: Self-pay

## 2018-06-15 DIAGNOSIS — Z3480 Encounter for supervision of other normal pregnancy, unspecified trimester: Secondary | ICD-10-CM

## 2018-06-15 DIAGNOSIS — Z1379 Encounter for other screening for genetic and chromosomal anomalies: Secondary | ICD-10-CM

## 2018-06-15 DIAGNOSIS — F411 Generalized anxiety disorder: Secondary | ICD-10-CM

## 2018-06-15 DIAGNOSIS — Z3A11 11 weeks gestation of pregnancy: Secondary | ICD-10-CM

## 2018-06-16 LAB — RPR+RH+ABO+RUB AB+AB SCR+CB...
Antibody Screen: NEGATIVE
HEP B S AG: NEGATIVE
HIV SCREEN 4TH GENERATION: NONREACTIVE
Hematocrit: 36.2 % (ref 34.0–46.6)
Hemoglobin: 12.5 g/dL (ref 11.1–15.9)
MCH: 30.8 pg (ref 26.6–33.0)
MCHC: 34.5 g/dL (ref 31.5–35.7)
MCV: 89 fL (ref 79–97)
PLATELETS: 224 10*3/uL (ref 150–450)
RBC: 4.06 x10E6/uL (ref 3.77–5.28)
RDW: 12.3 % (ref 12.3–15.4)
RPR: NONREACTIVE
Rh Factor: POSITIVE
Rubella Antibodies, IGG: 2.68 index (ref >0.99)
VARICELLA: 2095 {index} (ref >165)
WBC: 5.7 10*3/uL (ref 3.4–10.8)

## 2018-06-19 LAB — MATERNIT 21 PLUS CORE, BLOOD
CHROMOSOME 18: NEGATIVE
Chromosome 13: NEGATIVE
Chromosome 21: NEGATIVE
Y CHROMOSOME: DETECTED

## 2018-07-12 ENCOUNTER — Ambulatory Visit (INDEPENDENT_AMBULATORY_CARE_PROVIDER_SITE_OTHER): Payer: Self-pay | Admitting: Obstetrics and Gynecology

## 2018-07-12 ENCOUNTER — Encounter: Payer: Self-pay | Admitting: Obstetrics and Gynecology

## 2018-07-12 VITALS — BP 110/64 | Wt 156.0 lb

## 2018-07-12 DIAGNOSIS — Z3A15 15 weeks gestation of pregnancy: Secondary | ICD-10-CM

## 2018-07-12 DIAGNOSIS — Z3480 Encounter for supervision of other normal pregnancy, unspecified trimester: Secondary | ICD-10-CM

## 2018-07-12 DIAGNOSIS — O99342 Other mental disorders complicating pregnancy, second trimester: Secondary | ICD-10-CM

## 2018-07-12 DIAGNOSIS — F411 Generalized anxiety disorder: Secondary | ICD-10-CM

## 2018-07-12 DIAGNOSIS — O219 Vomiting of pregnancy, unspecified: Secondary | ICD-10-CM

## 2018-07-12 NOTE — Progress Notes (Signed)
  Routine Prenatal Care Visit  Subjective  Tiffany LoaderStacie Wiley is a 27 y.o. G2P1001 at 3659w2d being seen today for ongoing prenatal care.  She is currently monitored for the following issues for this low-risk pregnancy and has Generalized anxiety disorder; Supervision of other normal pregnancy, antepartum; and Nausea/vomiting in pregnancy on their problem list.  ----------------------------------------------------------------------------------- Patient reports nausea/emesis this AM. She forgot to take her Bonjesta medication last night.   Contractions: Not present. Vag. Bleeding: None.  Movement: Absent. Denies leaking of fluid.  ----------------------------------------------------------------------------------- The following portions of the patient's history were reviewed and updated as appropriate: allergies, current medications, past family history, past medical history, past social history, past surgical history and problem list. Problem list updated.   Objective  Blood pressure 110/64, weight 156 lb (70.8 kg), last menstrual period 03/05/2018. Pregravid weight 160 lb (72.6 kg) Total Weight Gain -4 lb (-1.814 kg) Urinalysis: Urine Protein    Urine Glucose    Fetal Status: Fetal Heart Rate (bpm): 155   Movement: Absent     General:  Alert, oriented and cooperative. Patient is in no acute distress.  Skin: Skin is warm and dry. No rash noted.   Cardiovascular: Normal heart rate noted  Respiratory: Normal respiratory effort, no problems with respiration noted  Abdomen: Soft, gravid, appropriate for gestational age.       Pelvic:  Cervical exam deferred        Extremities: Normal range of motion.  Edema: None  Mental Status: Normal mood and affect. Normal behavior. Normal judgment and thought content.   Assessment   27 y.o. G2P1001 at 3959w2d by  01/01/2019, by Ultrasound presenting for routine prenatal visit  Plan   Pregnancy # 2 Problems (from 03/05/18 to present)    Problem Noted Resolved   Nausea/vomiting in pregnancy 05/15/2018 by Conard NovakJackson, Bela Bonaparte D, MD No   Supervision of other normal pregnancy, antepartum 05/04/2018 by Conard NovakJackson, Jubal Rademaker D, MD No       Preterm labor symptoms and general obstetric precautions including but not limited to vaginal bleeding, contractions, leaking of fluid and fetal movement were reviewed in detail with the patient. Please refer to After Visit Summary for other counseling recommendations.   - Discussed msAFP testing. She will consider.  Return in about 4 weeks (around 08/09/2018) for U/S for anatomy and routine prenatal with Dr. Jean RosenthalJackson.  Thomasene MohairStephen Vinod Mikesell, MD, Merlinda FrederickFACOG [facility], [facility] 07/12/2018 10:49 AM

## 2018-07-12 NOTE — Patient Instructions (Signed)
Second Trimester of Pregnancy The second trimester is from week 13 through week 28, month 4 through 6. This is often the time in pregnancy that you feel your best. Often times, morning sickness has lessened or quit. You may have more energy, and you may get hungry more often. Your unborn baby (fetus) is growing rapidly. At the end of the sixth month, he or she is about 9 inches long and weighs about 1 pounds. You will likely feel the baby move (quickening) between 18 and 20 weeks of pregnancy. Follow these instructions at home:  Avoid all smoking, herbs, and alcohol. Avoid drugs not approved by your doctor.  Do not use any tobacco products, including cigarettes, chewing tobacco, and electronic cigarettes. If you need help quitting, ask your doctor. You may get counseling or other support to help you quit.  Only take medicine as told by your doctor. Some medicines are safe and some are not during pregnancy.  Exercise only as told by your doctor. Stop exercising if you start having cramps.  Eat regular, healthy meals.  Wear a good support bra if your breasts are tender.  Do not use hot tubs, steam rooms, or saunas.  Wear your seat belt when driving.  Avoid raw meat, uncooked cheese, and liter boxes and soil used by cats.  Take your prenatal vitamins.  Take 1500-2000 milligrams of calcium daily starting at the 20th week of pregnancy until you deliver your baby.  Try taking medicine that helps you poop (stool softener) as needed, and if your doctor approves. Eat more fiber by eating fresh fruit, vegetables, and whole grains. Drink enough fluids to keep your pee (urine) clear or pale yellow.  Take warm water baths (sitz baths) to soothe pain or discomfort caused by hemorrhoids. Use hemorrhoid cream if your doctor approves.  If you have puffy, bulging veins (varicose veins), wear support hose. Raise (elevate) your feet for 15 minutes, 3-4 times a day. Limit salt in your diet.  Avoid heavy  lifting, wear low heals, and sit up straight.  Rest with your legs raised if you have leg cramps or low back pain.  Visit your dentist if you have not gone during your pregnancy. Use a soft toothbrush to brush your teeth. Be gentle when you floss.  You can have sex (intercourse) unless your doctor tells you not to.  Go to your doctor visits. Get help if:  You feel dizzy.  You have mild cramps or pressure in your lower belly (abdomen).  You have a nagging pain in your belly area.  You continue to feel sick to your stomach (nauseous), throw up (vomit), or have watery poop (diarrhea).  You have bad smelling fluid coming from your vagina.  You have pain with peeing (urination). Get help right away if:  You have a fever.  You are leaking fluid from your vagina.  You have spotting or bleeding from your vagina.  You have severe belly cramping or pain.  You lose or gain weight rapidly.  You have trouble catching your breath and have chest pain.  You notice sudden or extreme puffiness (swelling) of your face, hands, ankles, feet, or legs.  You have not felt the baby move in over an hour.  You have severe headaches that do not go away with medicine.  You have vision changes. This information is not intended to replace advice given to you by your health care provider. Make sure you discuss any questions you have with your health care   provider. Document Released: 01/07/2010 Document Revised: 03/20/2016 Document Reviewed: 12/14/2012 Elsevier Interactive Patient Education  2017 [employer]  

## 2018-07-15 ENCOUNTER — Ambulatory Visit: Payer: Self-pay

## 2018-07-15 ENCOUNTER — Other Ambulatory Visit: Payer: Self-pay | Admitting: Obstetrics and Gynecology

## 2018-07-15 ENCOUNTER — Telehealth: Payer: Self-pay

## 2018-07-15 DIAGNOSIS — R300 Dysuria: Secondary | ICD-10-CM

## 2018-07-15 MED ORDER — NITROFURANTOIN MONOHYD MACRO 100 MG PO CAPS: 100.0000 mg | ORAL_CAPSULE | Freq: Two times a day (BID) | ORAL | 0 refills | Status: DC

## 2018-07-15 NOTE — Telephone Encounter (Signed)
Spoke with pt regarding UTI, she will come leave a urine sample for me to check and send off for culture and SDJ will send in appropriate medication.

## 2018-07-18 LAB — URINE CULTURE: ORGANISM ID, BACTERIA: NO GROWTH

## 2018-08-13 ENCOUNTER — Ambulatory Visit (INDEPENDENT_AMBULATORY_CARE_PROVIDER_SITE_OTHER): Payer: Self-pay | Admitting: Obstetrics and Gynecology

## 2018-08-13 ENCOUNTER — Ambulatory Visit (INDEPENDENT_AMBULATORY_CARE_PROVIDER_SITE_OTHER): Payer: Self-pay

## 2018-08-13 ENCOUNTER — Encounter: Payer: Self-pay | Admitting: Obstetrics and Gynecology

## 2018-08-13 VITALS — BP 118/74 | Wt 160.0 lb

## 2018-08-13 DIAGNOSIS — O9989 Other specified diseases and conditions complicating pregnancy, childbirth and the puerperium: Secondary | ICD-10-CM

## 2018-08-13 DIAGNOSIS — O99342 Other mental disorders complicating pregnancy, second trimester: Secondary | ICD-10-CM

## 2018-08-13 DIAGNOSIS — Z3A19 19 weeks gestation of pregnancy: Secondary | ICD-10-CM

## 2018-08-13 DIAGNOSIS — R000 Tachycardia, unspecified: Secondary | ICD-10-CM | POA: Insufficient documentation

## 2018-08-13 DIAGNOSIS — R002 Palpitations: Secondary | ICD-10-CM

## 2018-08-13 DIAGNOSIS — F411 Generalized anxiety disorder: Secondary | ICD-10-CM

## 2018-08-13 DIAGNOSIS — Z3480 Encounter for supervision of other normal pregnancy, unspecified trimester: Secondary | ICD-10-CM

## 2018-08-13 DIAGNOSIS — O219 Vomiting of pregnancy, unspecified: Secondary | ICD-10-CM

## 2018-08-13 DIAGNOSIS — Z363 Encounter for antenatal screening for malformations: Secondary | ICD-10-CM

## 2018-08-13 NOTE — Progress Notes (Signed)
Routine Prenatal Care Visit  Subjective  Tiffany Wiley is a 27 y.o. G2P1001 at [redacted]w[redacted]d being seen today for ongoing prenatal care.  She is currently monitored for the following issues for this low-risk pregnancy and has Generalized anxiety disorder; Supervision of other normal pregnancy, antepartum; Nausea/vomiting in pregnancy; Heart palpitations; and Tachycardia on their problem list.  ----------------------------------------------------------------------------------- Patient reports notable heart palpitations which she states that she mostly feels at night.  She denies concurrent shortness of breath and chest pain.  She does note that she can also feel this palpating sensation in her right lower quadrant.  She states that she has a history of palpitations.  However, her current symptoms are new and different than from her prior episodes.  She states that her heart rate can be as high as the 160s.  Her husband, who is a Company secretary, has auscultated her heart during these episodes and has heard what he describes as an irregular heartbeat. Contractions: Not present. Vag. Bleeding: None.  Movement: Present. Denies leaking of fluid.  Anatomy ultrasound today is incomplete due to suboptimal visualization of the heart views and spine, as well as the nose and lip area. ----------------------------------------------------------------------------------- The following portions of the patient's history were reviewed and updated as appropriate: allergies, current medications, past family history, past medical history, past social history, past surgical history and problem list. Problem list updated.  Objective  Blood pressure 118/74, weight 160 lb (72.6 kg), last menstrual period 03/05/2018. Pregravid weight 160 lb (72.6 kg) Total Weight Gain 0 lb (0 kg) Urinalysis: Urine Protein    Urine Glucose    Fetal Status: Fetal Heart Rate (bpm): Present   Movement: Present     General:  Alert, oriented and cooperative.  Patient is in no acute distress.  Skin: Skin is warm and dry. No rash noted.   Cardiovascular: Normal heart rate noted, palpated heart rate 100 bpm  Respiratory: Normal respiratory effort, no problems with respiration noted  Abdomen: Soft, gravid, appropriate for gestational age.       Pelvic:  Cervical exam deferred        Extremities: Normal range of motion.     Mental Status: Normal mood and affect. Normal behavior. Normal judgment and thought content.   Imaging Results: US Ob Comp + 14 Wk  Result Date: 08/13/2018 Patient Name: Ragena Fiola DOB: Jul 03, 1991 MRN: [id number] ULTRASOUND REPORT Location: [facility] Date of Service: 08/13/2018 Indications:Anatomy Ultrasound Findings: Mason Jim intrauterine pregnancy is visualized with FHR at 145 BPM. Biometrics give an (U/S) Gestational age of [redacted]w[redacted]d and an (U/S) EDD of 12/24/18; this correlates with the clinically established Estimated Date of Delivery: 01/01/19 Fetal presentation is Cephalic. EFW: 15oz. Placenta: posterior. Grade: 0 AFI: subjectively normal. Anatomic survey is incomplete for 4 chamber view, RVOT, and spinal views suboptimal; Gender - female. Right Ovary is normal in appearance. Left Ovary is normal appearance. Survey of the adnexa demonstrates no adnexal masses. There is no free peritoneal fluid in the cul de sac. Impression: 1. [redacted]w[redacted]d Viable Singleton Intrauterine pregnancy by U/S. 2. (U/S) EDD is consistent with Clinically established Estimated Date of Delivery: 01/01/19 . 3. Normal Anatomy Scan with suboptimal cardiac and spinal views Recommendations: 1.Clinical correlation with the patient's History and Physical Exam. 2. Follow up ultrasound to complete suboptimal views Abeer Alsammarraie,RDMS There is a singleton gestation with subjectively normal amniotic fluid volume. The fetal biometry correlates with established dating. Detailed evaluation of the fetal anatomy was performed.The fetal anatomical survey appears within normal limits  within the resolution  of ultrasound as described above.  Not all structures were able to be visualized on today's study.  It is recommended that a follow-up ultrasound be performed to complete visualization of the unobserved structures today.  It must be noted that a normal ultrasound is unable to rule out fetal aneuploidy nor is it able to detect all possible malformations.   The ultrasound images and findings were reviewed by me and I agree with the above report. Thomasene Mohair, MD, [facility], [facility] 08/13/2018 2:19 PM       Assessment   27 y.o. G2P1001 at [redacted]w[redacted]d by  01/01/2019, by Ultrasound presenting for routine prenatal visit  Plan   Pregnancy # 2 Problems (from 03/05/18 to present)    Problem Noted Resolved   Heart palpitations 08/13/2018 by Conard Novak, MD No   Tachycardia 08/13/2018 by Conard Novak, MD No   Nausea/vomiting in pregnancy 05/15/2018 by Conard Novak, MD No   Supervision of other normal pregnancy, antepartum 05/04/2018 by Conard Novak, MD No       Preterm labor symptoms and general obstetric precautions including but not limited to vaginal bleeding, contractions, leaking of fluid and fetal movement were reviewed in detail with the patient. Please refer to After Visit Summary for other counseling recommendations.   Heart palpitations and tachycardia: We will continue to monitor in the short-term.  Her husband is able to take her to the fire station and obtain an EKG tracing while she is symptomatic.  If her symptoms persist and she is unable to obtain an EKG tracing in the interim, will consider referral to cardiology for further evaluation.  Return in about 3 weeks (around 09/03/2018) for schedule anatomy completion and routine prenatal with Dr. Jean Rosenthal.  Thomasene Mohair, MD, [facility], [facility] 08/13/2018 6:09 PM

## 2018-08-19 ENCOUNTER — Emergency Department
Admission: EM | Admit: 2018-08-19 | Discharge: 2018-08-20 | Disposition: A | Discharge status: Home / Self Care | Payer: Self-pay | Attending: Student in an Organized Health Care Education/Training Program | Admitting: Student in an Organized Health Care Education/Training Program

## 2018-08-19 ENCOUNTER — Emergency Department: Payer: Self-pay

## 2018-08-19 DIAGNOSIS — O26899 Other specified pregnancy related conditions, unspecified trimester: Secondary | ICD-10-CM | POA: Insufficient documentation

## 2018-08-19 DIAGNOSIS — R079 Chest pain, unspecified: Secondary | ICD-10-CM | POA: Insufficient documentation

## 2018-08-19 DIAGNOSIS — Z3A20 20 weeks gestation of pregnancy: Secondary | ICD-10-CM | POA: Insufficient documentation

## 2018-08-19 DIAGNOSIS — R002 Palpitations: Secondary | ICD-10-CM | POA: Insufficient documentation

## 2018-08-19 DIAGNOSIS — R791 Abnormal coagulation profile: Secondary | ICD-10-CM | POA: Insufficient documentation

## 2018-08-19 DIAGNOSIS — Z79899 Other long term (current) drug therapy: Secondary | ICD-10-CM | POA: Insufficient documentation

## 2018-08-19 LAB — CBC
HEMATOCRIT: 31.9 % — AB (ref 36.0–46.0)
HEMOGLOBIN: 10.9 g/dL — AB (ref 12.0–15.0)
MCH: 30.5 pg (ref 26.0–34.0)
MCHC: 34.2 g/dL (ref 30.0–36.0)
MCV: 89.4 fL (ref 80.0–100.0)
NRBC: 0 % (ref 0.0–0.2)
Platelets: 185 10*3/uL (ref 150–400)
RBC: 3.57 MIL/uL — ABNORMAL LOW (ref 3.87–5.11)
RDW: 13.2 % (ref 11.5–15.5)
WBC: 8.6 10*3/uL (ref 4.0–10.5)

## 2018-08-19 LAB — TSH: TSH: 2.881 u[IU]/mL (ref 0.350–4.500)

## 2018-08-19 LAB — TROPONIN I

## 2018-08-19 LAB — BASIC METABOLIC PANEL
Anion gap: 7 (ref 5–15)
BUN: 8 mg/dL (ref 6–20)
CHLORIDE: 108 mmol/L (ref 98–111)
CO2: 21 mmol/L — ABNORMAL LOW (ref 22–32)
Calcium: 8.9 mg/dL (ref 8.9–10.3)
Creatinine, Ser: 0.48 mg/dL (ref 0.44–1.00)
Glucose, Bld: 96 mg/dL (ref 70–99)
POTASSIUM: 3.6 mmol/L (ref 3.5–5.1)
Sodium: 136 mmol/L (ref 135–145)

## 2018-08-19 LAB — FIBRIN DERIVATIVES D-DIMER (ARMC ONLY): FIBRIN DERIVATIVES D-DIMER (ARMC): 1405.54 ng{FEU}/mL — AB (ref 0.00–499.00)

## 2018-08-19 MED ORDER — SODIUM CHLORIDE 0.9 % IV BOLUS
1000.0000 mL | Freq: Once | INTRAVENOUS | Status: AC
  Administered 2018-08-19: 1000 mL via INTRAVENOUS

## 2018-08-19 NOTE — ED Triage Notes (Signed)
Patient c/o intermittent medial chest pain. Patient reports pain began again last night. Patient describes pain as pressure. Patient reports concurrent symptoms of dizziness, SOB. Patient is [redacted] weeks pregnant.

## 2018-08-19 NOTE — ED Provider Notes (Signed)
[facility] Emergency Department Provider Note    First MD Initiated Contact with Patient 08/19/18 2148     (approximate)  I have reviewed the triage vital signs and the nursing notes.   HISTORY  Chief Complaint Chest Pain    HPI Tiffany Wiley is a 27 y.o. female is [redacted] weeks pregnant presents to the ER with intermittent midsternal chest pain and palpitations.  And says it got much worse last night.  Has been talking with her OB/GYN about the symptoms but does feel she is developing worsening shortness of breath and chest discomfort.  Denies any history of blood clots.  Denies any fevers.  No vaginal bleeding.  No cough.    Past Medical History:  Diagnosis Date  . Medical history non-contributory   . Urinary tract infection    Family History  Problem Relation Age of Onset  . Breast cancer Other   . Healthy Mother    Past Surgical History:  Procedure Laterality Date  . NO PAST SURGERIES     Patient Active Problem List   Diagnosis Date Noted  . Heart palpitations 08/13/2018  . Tachycardia 08/13/2018  . Nausea/vomiting in pregnancy 05/15/2018  . Supervision of other normal pregnancy, antepartum 05/04/2018  . Generalized anxiety disorder 09/30/2017      Prior to Admission medications   Medication Sig Start Date End Date Taking? Authorizing Provider  Doxylamine-Pyridoxine ER (BONJESTA) 20-20 MG TBCR Take 1 tablet by mouth 2 (two) times daily. 05/15/18  Yes Conard Novak, MD  Prenatal Vit-Fe Fumarate-FA (PRENATAL MULTIVITAMIN) TABS tablet Take 1 tablet by mouth daily at 12 noon.   Yes [provider]  nitrofurantoin, macrocrystal-monohydrate, (MACROBID) 100 MG capsule Take 1 capsule (100 mg total) by mouth 2 (two) times daily. Patient not taking: Reported on 08/19/2018 07/15/18   Conard Novak, MD    Allergies Other and Shellfish allergy    Social History Social History   Tobacco Use  . Smoking status: Never Smoker  .  Smokeless tobacco: Never Used  Substance Use Topics  . Alcohol use: No  . Drug use: No    Review of Systems Patient denies headaches, rhinorrhea, blurry vision, numbness, shortness of breath, chest pain, edema, cough, abdominal pain, nausea, vomiting, diarrhea, dysuria, fevers, rashes or hallucinations unless otherwise stated above in HPI. ____________________________________________   PHYSICAL EXAM:  VITAL SIGNS: Vitals:   08/19/18 1943  BP: 131/62  Pulse: 98  Resp: 18  Temp: 98.7 F (37.1 C)  SpO2: 99%    Constitutional: Alert and oriented.  Eyes: Conjunctivae are normal.  Head: Atraumatic. Nose: No congestion/rhinnorhea. Mouth/Throat: Mucous membranes are moist.   Neck: No stridor. Painless ROM.  Cardiovascular: tachycardic, regular rhythm. Grossly normal heart sounds.  Good peripheral circulation. Respiratory: Normal respiratory effort.  No retractions. Lungs CTAB. Gastrointestinal: Soft and nontender, gravid. No distention. No abdominal bruits. No CVA tenderness. Genitourinary:  Musculoskeletal: No lower extremity tenderness nor edema.  No joint effusions. Neurologic:  Normal speech and language. No gross focal neurologic deficits are appreciated. No facial droop Skin:  Skin is warm, dry and intact. No rash noted. Psychiatric: Mood and affect are normal. Speech and behavior are normal.  ____________________________________________   LABS (all labs ordered are listed, but only abnormal results are displayed)  Results for orders placed or performed during the hospital encounter of 08/19/18 (from the past 24 hour(s))  Basic metabolic panel     Status: Abnormal   Collection Time: 08/19/18  8:02 PM  Result Value Ref Range   Sodium 136 135 - 145 mmol/L   Potassium 3.6 3.5 - 5.1 mmol/L   Chloride 108 98 - 111 mmol/L   CO2 21 (L) 22 - 32 mmol/L   Glucose, Bld 96 70 - 99 mg/dL   BUN 8 6 - 20 mg/dL   Creatinine, Ser 1.61 0.44 - 1.00 mg/dL   Calcium 8.9 8.9 - 09.6  mg/dL   GFR calc non Af Amer >60 >60 mL/min   GFR calc Af Amer >60 >60 mL/min   Anion gap 7 5 - 15  CBC     Status: Abnormal   Collection Time: 08/19/18  8:02 PM  Result Value Ref Range   WBC 8.6 4.0 - 10.5 K/uL   RBC 3.57 (L) 3.87 - 5.11 MIL/uL   Hemoglobin 10.9 (L) 12.0 - 15.0 g/dL   HCT 04.5 (L) 40.9 - 81.1 %   MCV 89.4 80.0 - 100.0 fL   MCH 30.5 26.0 - 34.0 pg   MCHC 34.2 30.0 - 36.0 g/dL   RDW 91.4 78.2 - 95.6 %   Platelets 185 150 - 400 K/uL   nRBC 0.0 0.0 - 0.2 %  Troponin I     Status: None   Collection Time: 08/19/18  8:02 PM  Result Value Ref Range   Troponin I <0.03 <0.03 ng/mL  Fibrin derivatives D-Dimer ([facility] only)     Status: Abnormal   Collection Time: 08/19/18 10:34 PM  Result Value Ref Range   Fibrin derivatives D-dimer ([facility]) 1,405.54 (H) 0.00 - 499.00 ng/mL (FEU)  TSH     Status: None   Collection Time: 08/19/18 10:34 PM  Result Value Ref Range   TSH 2.881 0.350 - 4.500 uIU/mL   ____________________________________________  EKG My review and personal interpretation at Time: 19:55   Indication: palpitations  Rate: 105  Rhythm: sinus tach Axis: normal Other: occasional pvc ____________________________________________  RADIOLOGY  I personally reviewed all radiographic images ordered to evaluate for the above acute complaints and reviewed radiology reports and findings.  These findings were personally discussed with the patient.  Please see medical record for radiology report.  ____________________________________________   PROCEDURES  Procedure(s) performed:  Procedures    Critical Care performed: no ____________________________________________   INITIAL IMPRESSION / ASSESSMENT AND PLAN / ED COURSE  Pertinent labs & imaging results that were available during my care of the patient were reviewed by me and considered in my medical decision making (see chart for details).   DDX: ACS, pericarditis, esophagitis, boerhaaves, pe, dissection, pna,  bronchitis, costochondritis   Tiffany Wiley is a 27 y.o. who presents to the ED with symptoms as described above.  Patient actually arrives with printed EKG strip obtained well to fire department with her husband where he works.  Did show evidence of sinus dysrhythmia with occasional PVCs.  Did show evidence of sinus tachycardia as the predominant rhythm.  Chest x-ray does not show any evidence of cardiomegaly, pneumothorax or consolidation.  No significant anemia.  No evidence of infectious process.  Will check TSH as well as d-dimer as well as give IV fluids.  Clinical Course as of Aug 21 43  Thu Aug 19, 2018  2353 Potassium: 3.6 [PR]  Fri Aug 20, 2018  2130 Bedside ultrasound shows reassuring fetal heart tones as well as fetal movement.  Abdominal exam soft and benign.  TSH is normal.  Ultrasound lower extremities did not show any evidence of blood clot.  Again discussed risks and benefits of  further diagnostic testing with CT angiogram given her presentation including radiation exposure to a developing fetus.  With her consent and after extensive discussion we have elected to proceed with CT angiogram of the chest.  Patient will be signed out to Dr. Dolores Frame pending results.  Patient discharged home if no evidence of PE.   [PR]    Clinical Course User Index [PR] Willy Eddy, MD     As part of my medical decision making, I reviewed the following data within the electronic MEDICAL RECORD NUMBER Nursing notes reviewed and incorporated, Labs reviewed, notes from prior ED visits.  ____________________________________________   FINAL CLINICAL IMPRESSION(S) / ED DIAGNOSES  Final diagnoses:  Palpitations  Chest pain, unspecified type      NEW MEDICATIONS STARTED DURING THIS VISIT:  New Prescriptions   No medications on file     Note:  This document was prepared using Dragon voice recognition software and may include unintentional dictation errors.    Willy Eddy, MD 08/20/18  [phone]

## 2018-08-20 ENCOUNTER — Encounter: Payer: Self-pay | Admitting: Radiology

## 2018-08-20 ENCOUNTER — Emergency Department: Payer: Self-pay

## 2018-08-20 MED ORDER — IOHEXOL 350 MG/ML SOLN
75.0000 mL | Freq: Once | INTRAVENOUS | Status: AC | PRN
  Administered 2018-08-20: 75 mL via INTRAVENOUS

## 2018-08-20 NOTE — Discharge Instructions (Addendum)
Return to the ER for worsening symptoms, persistent vomiting, difficulty breathing or other concerns. °

## 2018-08-20 NOTE — ED Provider Notes (Signed)
-----------------------------------------   2:15 AM on 08/20/2018 -----------------------------------------  ED interpretation: No PE  CTA chest interpreted per Dr. Kearney Hard: No evidence of pulmonary embolus.    No acute cardiopulmonary disease.    Updated patient and spouse of negative CT results.  Will discharge home with cardiology follow-up.  Strict return precautions given.  Both verbalize understanding and agree with plan of care.   Irean Hong, MD 08/20/18 [phone]

## 2018-09-03 ENCOUNTER — Encounter: Payer: Self-pay | Admitting: Obstetrics and Gynecology

## 2018-09-03 ENCOUNTER — Ambulatory Visit (INDEPENDENT_AMBULATORY_CARE_PROVIDER_SITE_OTHER): Payer: Self-pay | Admitting: Obstetrics and Gynecology

## 2018-09-03 ENCOUNTER — Ambulatory Visit (INDEPENDENT_AMBULATORY_CARE_PROVIDER_SITE_OTHER): Payer: Self-pay

## 2018-09-03 VITALS — BP 122/76 | Wt 164.0 lb

## 2018-09-03 DIAGNOSIS — R000 Tachycardia, unspecified: Secondary | ICD-10-CM

## 2018-09-03 DIAGNOSIS — Z3A22 22 weeks gestation of pregnancy: Secondary | ICD-10-CM

## 2018-09-03 DIAGNOSIS — Z131 Encounter for screening for diabetes mellitus: Secondary | ICD-10-CM

## 2018-09-03 DIAGNOSIS — O219 Vomiting of pregnancy, unspecified: Secondary | ICD-10-CM

## 2018-09-03 DIAGNOSIS — Z3480 Encounter for supervision of other normal pregnancy, unspecified trimester: Secondary | ICD-10-CM

## 2018-09-03 DIAGNOSIS — O9989 Other specified diseases and conditions complicating pregnancy, childbirth and the puerperium: Secondary | ICD-10-CM

## 2018-09-03 DIAGNOSIS — Z113 Encounter for screening for infections with a predominantly sexual mode of transmission: Secondary | ICD-10-CM

## 2018-09-03 DIAGNOSIS — F411 Generalized anxiety disorder: Secondary | ICD-10-CM

## 2018-09-03 DIAGNOSIS — O99342 Other mental disorders complicating pregnancy, second trimester: Secondary | ICD-10-CM

## 2018-09-03 DIAGNOSIS — Z362 Encounter for other antenatal screening follow-up: Secondary | ICD-10-CM

## 2018-09-03 NOTE — Progress Notes (Signed)
Routine Prenatal Care Visit  Subjective  Tiffany Wiley is a 27 y.o. G2P1001 at [redacted]w[redacted]d being seen today for ongoing prenatal care.  She is currently monitored for the following issues for this low-risk pregnancy and has Generalized anxiety disorder; Supervision of other normal pregnancy, antepartum; Nausea/vomiting in pregnancy; Heart palpitations; and Tachycardia on their problem list.  ----------------------------------------------------------------------------------- Patient reports no complaints.  No changes in cardiac symptoms. She is having trouble getting in to see Cards due to insurance status.  Contractions: Not present. Vag. Bleeding: None, Bloody Show.  Movement: Present. Denies leaking of fluid.  Anatomy scan complete today.  ----------------------------------------------------------------------------------- The following portions of the patient's history were reviewed and updated as appropriate: allergies, current medications, past family history, past medical history, past social history, past surgical history and problem list. Problem list updated.   Objective  Blood pressure 122/76, weight 164 lb (74.4 kg), last menstrual period 03/05/2018. Pregravid weight 160 lb (72.6 kg) Total Weight Gain 4 lb (1.814 kg) Urinalysis: Urine Protein    Urine Glucose    Fetal Status: Fetal Heart Rate (bpm): present   Movement: Present     General:  Alert, oriented and cooperative. Patient is in no acute distress.  Skin: Skin is warm and dry. No rash noted.   Cardiovascular: Normal heart rate noted  Respiratory: Normal respiratory effort, no problems with respiration noted  Abdomen: Soft, gravid, appropriate for gestational age.       Pelvic:  Cervical exam deferred        Extremities: Normal range of motion.  Edema: None  Mental Status: Normal mood and affect. Normal behavior. Normal judgment and thought content.   Imaging Results: US Ob Follow Up  Result Date: 09/03/2018 Patient Name:  Tiffany Wiley DOB: Jan 19, 1991 MRN: [id number] ULTRASOUND REPORT Location: [facility] Date of Service: 09/03/2018 Indications: Anatomy follow up ultrasound Findings: Mason Jim intrauterine pregnancy is visualized with FHR at 148 BPM. Fetal presentation is Cephalic. Placenta: posterior. Grade: 0 AFI: subjectively normal. Anatomic survey is complete for spine, 4 chamber heart and outflow tracts. There is no free peritoneal fluid in the cul de sac. Impression: 1. [redacted]w[redacted]d Viable Singleton Intrauterine pregnancy previously established criteria. 2. Normal Anatomy Scan is now complete Darlina Guys, RDMS RVT There is a singleton gestation with subjectively normal amniotic fluid volume. Detailed evaluation of the fetal anatomy was performed. The fetal anatomical survey appears within normal limits within the resolution of ultrasound as described above.  Not all structures were attempted to be visualized as they were noted on a previous exam. See the prior exam for full details.  It must be noted that a normal ultrasound is unable to rule out fetal aneuploidy nor is it able to detect all possible malformations.  The ultrasound images and findings were reviewed by me and I agree with the above report. Thomasene Mohair, MD, [facility], [facility] 09/03/2018 2:30 PM       Assessment   27 y.o. G2P1001 at [redacted]w[redacted]d by  01/01/2019, by Ultrasound presenting for routine prenatal visit  Plan   Pregnancy # 2 Problems (from 03/05/18 to present)    Problem Noted Resolved   Heart palpitations 08/13/2018 by Conard Novak, MD No   Tachycardia 08/13/2018 by Conard Novak, MD No   Nausea/vomiting in pregnancy 05/15/2018 by Conard Novak, MD No   Supervision of other normal pregnancy, antepartum 05/04/2018 by Conard Novak, MD No       Preterm labor symptoms and general obstetric  precautions including but not limited to vaginal bleeding, contractions, leaking of fluid and fetal movement were  reviewed in detail with the patient. Please refer to After Visit Summary for other counseling recommendations.   Return in about 4 weeks (around 10/01/2018) for 28 week labs and routine prenatal.  Thomasene Mohair, MD, [facility], [facility] 09/03/2018 2:48 PM

## 2018-09-29 ENCOUNTER — Ambulatory Visit (INDEPENDENT_AMBULATORY_CARE_PROVIDER_SITE_OTHER): Payer: Self-pay | Admitting: Obstetrics and Gynecology

## 2018-09-29 ENCOUNTER — Encounter: Payer: Self-pay | Admitting: Obstetrics and Gynecology

## 2018-09-29 ENCOUNTER — Other Ambulatory Visit: Payer: Self-pay

## 2018-09-29 VITALS — BP 124/74 | Wt 164.0 lb

## 2018-09-29 DIAGNOSIS — R300 Dysuria: Secondary | ICD-10-CM

## 2018-09-29 DIAGNOSIS — Z3480 Encounter for supervision of other normal pregnancy, unspecified trimester: Secondary | ICD-10-CM

## 2018-09-29 DIAGNOSIS — O26893 Other specified pregnancy related conditions, third trimester: Secondary | ICD-10-CM

## 2018-09-29 DIAGNOSIS — O99342 Other mental disorders complicating pregnancy, second trimester: Secondary | ICD-10-CM

## 2018-09-29 DIAGNOSIS — Z3A26 26 weeks gestation of pregnancy: Secondary | ICD-10-CM

## 2018-09-29 DIAGNOSIS — R109 Unspecified abdominal pain: Secondary | ICD-10-CM

## 2018-09-29 DIAGNOSIS — O2340 Unspecified infection of urinary tract in pregnancy, unspecified trimester: Secondary | ICD-10-CM | POA: Insufficient documentation

## 2018-09-29 DIAGNOSIS — F411 Generalized anxiety disorder: Secondary | ICD-10-CM

## 2018-09-29 DIAGNOSIS — O2342 Unspecified infection of urinary tract in pregnancy, second trimester: Secondary | ICD-10-CM

## 2018-09-29 DIAGNOSIS — Z131 Encounter for screening for diabetes mellitus: Secondary | ICD-10-CM

## 2018-09-29 LAB — POCT URINALYSIS DIPSTICK
Bilirubin, UA: NEGATIVE
GLUCOSE UA: NEGATIVE
Ketones, UA: NEGATIVE
NITRITE UA: NEGATIVE
PH UA: 7 (ref 5.0–8.0)
PROTEIN UA: NEGATIVE
RBC UA: POSITIVE
Spec Grav, UA: 1.02 (ref 1.010–1.025)
Urobilinogen, UA: NEGATIVE E.U./dL — AB

## 2018-09-29 MED ORDER — SULFAMETHOXAZOLE-TRIMETHOPRIM 800-160 MG PO TABS: 1.0000 | ORAL_TABLET | Freq: Two times a day (BID) | ORAL | 0 refills | Status: DC

## 2018-09-29 MED ORDER — NITROFURANTOIN MONOHYD MACRO 100 MG PO CAPS: 100.0000 mg | ORAL_CAPSULE | Freq: Every day | ORAL | 5 refills | Status: DC

## 2018-09-29 NOTE — Progress Notes (Signed)
  Routine Prenatal Care Visit  Subjective  Tiffany LoaderStacie Wiley is a 27 y.o. G2P1001 at 6248w4d being seen today for ongoing prenatal care.  She is currently monitored for the following issues for this low-risk pregnancy and has Generalized anxiety disorder; Supervision of other normal pregnancy, antepartum; Nausea/vomiting in pregnancy; Heart palpitations; Tachycardia; and UTI (urinary tract infection) during pregnancy on their problem list.  ----------------------------------------------------------------------------------- Patient reports dysuria and some mild mid back pain, bilateral (L>R).  Denies fevers, chills. She does not some mild itching on her legs and hands and feet.  Contractions: Not present. Vag. Bleeding: None.  Movement: Present. Denies leaking of fluid.  ----------------------------------------------------------------------------------- The following portions of the patient's history were reviewed and updated as appropriate: allergies, current medications, past family history, past medical history, past social history, past surgical history and problem list. Problem list updated.   Objective  Blood pressure 124/74, weight 164 lb (74.4 kg), last menstrual period 03/05/2018. Pregravid weight 160 lb (72.6 kg) Total Weight Gain 4 lb (1.814 kg) Urinalysis: Urine Protein    Urine Glucose    Fetal Status: Fetal Heart Rate (bpm): 145 Fundal Height: 28 cm Movement: Present     General:  Alert, oriented and cooperative. Patient is in no acute distress.  Skin: Skin is warm and dry. No rash noted.   Cardiovascular: Normal heart rate noted  Respiratory: Normal respiratory effort, no problems with respiration noted  Abdomen: Soft, gravid, appropriate for gestational age. Pain/Pressure: Present     Pelvic:  Cervical exam deferred        Extremities: Normal range of motion.  Edema: None  Mental Status: Normal mood and affect. Normal behavior. Normal judgment and thought content.   Assessment    27 y.o. G2P1001 at 4148w4d by  01/01/2019, by Ultrasound presenting for routine prenatal visit  Plan   Pregnancy # 2 Problems (from 03/05/18 to present)    Problem Noted Resolved   UTI (urinary tract infection) during pregnancy 09/29/2018 by Conard NovakJackson, Omaya Nieland D, MD No   Heart palpitations 08/13/2018 by Conard NovakJackson, Joban Colledge D, MD No   Tachycardia 08/13/2018 by Conard NovakJackson, Vivienne Sangiovanni D, MD No   Nausea/vomiting in pregnancy 05/15/2018 by Conard NovakJackson, Amber Guthridge D, MD No   Supervision of other normal pregnancy, antepartum 05/04/2018 by Conard NovakJackson, Aima Mcwhirt D, MD No       Preterm labor symptoms and general obstetric precautions including but not limited to vaginal bleeding, contractions, leaking of fluid and fetal movement were reviewed in detail with the patient. Please refer to After Visit Summary for other counseling recommendations.   - If itching persists, will check bile acids.   - 1h gtt today with CBC - Will treat as UTI as she has a history of UTI's outside of pregnancy.  Will start daily suppression after acute treatment given her history of recurrent UTIs.   Return in about 3 weeks (around 10/20/2018) for Routine Prenatal Appointment.  Thomasene MohairStephen Deny Chevez, MD, Merlinda FrederickFACOG [facility], [facility] 09/29/2018 12:50 PM

## 2018-09-30 ENCOUNTER — Other Ambulatory Visit: Payer: Self-pay | Admitting: Obstetrics and Gynecology

## 2018-09-30 DIAGNOSIS — Z3480 Encounter for supervision of other normal pregnancy, unspecified trimester: Secondary | ICD-10-CM

## 2018-09-30 DIAGNOSIS — O99810 Abnormal glucose complicating pregnancy: Secondary | ICD-10-CM | POA: Insufficient documentation

## 2018-09-30 LAB — CBC
Hematocrit: 30.7 % — ABNORMAL LOW (ref 34.0–46.6)
Hemoglobin: 10.2 g/dL — ABNORMAL LOW (ref 11.1–15.9)
MCH: 28.8 pg (ref 26.6–33.0)
MCHC: 33.2 g/dL (ref 31.5–35.7)
MCV: 87 fL (ref 79–97)
PLATELETS: 186 10*3/uL (ref 150–450)
RBC: 3.54 x10E6/uL — AB (ref 3.77–5.28)
RDW: 12.4 % (ref 12.3–15.4)
WBC: 8.7 10*3/uL (ref 3.4–10.8)

## 2018-09-30 LAB — GLUCOSE, 1 HOUR GESTATIONAL: GESTATIONAL DIABETES SCREEN: 151 mg/dL — AB (ref 65–139)

## 2018-09-30 NOTE — Progress Notes (Signed)
FYI, Morenike didn't pass her 1 hour gtt. I sent her a message.  She should feel free to contact me with questions. But, she does need a 3 hour gtt.  If it would be cheaper for her to get a meter and check her blood glucose values for a week, then we could go that route.  I have put in a 3 hour lab order for her.

## 2018-10-03 ENCOUNTER — Other Ambulatory Visit: Payer: Self-pay | Admitting: Obstetrics and Gynecology

## 2018-10-03 DIAGNOSIS — O2342 Unspecified infection of urinary tract in pregnancy, second trimester: Secondary | ICD-10-CM

## 2018-10-03 MED ORDER — AMOXICILLIN-POT CLAVULANATE 875-125 MG PO TABS: 1.0000 | ORAL_TABLET | Freq: Two times a day (BID) | ORAL | 0 refills | Status: AC

## 2018-10-05 ENCOUNTER — Ambulatory Visit (INDEPENDENT_AMBULATORY_CARE_PROVIDER_SITE_OTHER): Payer: Self-pay | Admitting: Obstetrics and Gynecology

## 2018-10-05 ENCOUNTER — Other Ambulatory Visit: Payer: Self-pay

## 2018-10-05 VITALS — BP 122/70 | Temp 98.0°F | Wt 164.0 lb

## 2018-10-05 DIAGNOSIS — O219 Vomiting of pregnancy, unspecified: Secondary | ICD-10-CM

## 2018-10-05 DIAGNOSIS — O24410 Gestational diabetes mellitus in pregnancy, diet controlled: Secondary | ICD-10-CM

## 2018-10-05 DIAGNOSIS — O0990 Supervision of high risk pregnancy, unspecified, unspecified trimester: Secondary | ICD-10-CM

## 2018-10-05 DIAGNOSIS — O99810 Abnormal glucose complicating pregnancy: Secondary | ICD-10-CM

## 2018-10-05 DIAGNOSIS — Z3480 Encounter for supervision of other normal pregnancy, unspecified trimester: Secondary | ICD-10-CM

## 2018-10-05 DIAGNOSIS — R102 Pelvic and perineal pain: Secondary | ICD-10-CM

## 2018-10-05 DIAGNOSIS — O2343 Unspecified infection of urinary tract in pregnancy, third trimester: Secondary | ICD-10-CM

## 2018-10-05 DIAGNOSIS — O26893 Other specified pregnancy related conditions, third trimester: Secondary | ICD-10-CM

## 2018-10-05 DIAGNOSIS — Z3A28 28 weeks gestation of pregnancy: Secondary | ICD-10-CM

## 2018-10-05 DIAGNOSIS — F411 Generalized anxiety disorder: Secondary | ICD-10-CM

## 2018-10-05 NOTE — Progress Notes (Signed)
Routine Prenatal Care Visit  Subjective  Arsenio LoaderStacie Yassin is a 27 y.o. G2P1001 at 7126w1d being seen today for ongoing prenatal care.  She is currently monitored for the following issues for this high-risk pregnancy and has Generalized anxiety disorder; Supervision of high risk pregnancy, antepartum; Nausea/vomiting in pregnancy; Heart palpitations; Tachycardia; UTI (urinary tract infection) during pregnancy; and Gestational diabetes on their problem list.  ----------------------------------------------------------------------------------- Patient reports having a lot of lower abdominal pain, nausea, cramping. Antibiotic not helping. 98.0 temp. Dysuria. She describes her pain as in her lower abdomen and pelvis.  Her back pain is better overall.  She has not yet felt like the new antibiotic is helping.   Contractions: Not present. Vag. Bleeding: None.  Movement: Present. Denies leaking of fluid.  ----------------------------------------------------------------------------------- The following portions of the patient's history were reviewed and updated as appropriate: allergies, current medications, past family history, past medical history, past social history, past surgical history and problem list. Problem list updated.   Objective  Blood pressure 122/70, temperature 98 F (36.7 C), weight 164 lb (74.4 kg), last menstrual period 03/05/2018. Pregravid weight 160 lb (72.6 kg) Total Weight Gain 4 lb (1.814 kg) Urinalysis: Urine Protein    Urine Glucose    Fetal Status: Fetal Heart Rate (bpm): 140 Fundal Height: 28 cm Movement: Present     General:  Alert, oriented and cooperative. Patient is in no acute distress.  Skin: Skin is warm and dry. No rash noted.   Cardiovascular: Normal heart rate noted  Respiratory: Normal respiratory effort, no problems with respiration noted  Abdomen: Soft, gravid, appropriate for gestational age. Pain/Pressure: Present     Pelvic:  Cervical exam performed Dilation:  Closed Effacement (%): 30 Station: Ballotable  Extremities: Normal range of motion.  Edema: None  Mental Status: Normal mood and affect. Normal behavior. Normal judgment and thought content.   Wet Prep: PH: 4.0 Clue Cells: Negative Fungal elements: Negative Trichomonas: Negative  Assessment   27 y.o. G2P1001 at 2226w1d by  01/01/2019, by Ultrasound presenting for routine prenatal visit  Plan   Pregnancy # 2 Problems (from 03/05/18 to present)    Problem Noted Resolved   Gestational diabetes 10/06/2018 by Conard NovakJackson, Miliana Gangwer D, MD No   UTI (urinary tract infection) during pregnancy 09/29/2018 by Conard NovakJackson, Harlow Basley D, MD No   Heart palpitations 08/13/2018 by Conard NovakJackson, Vonzell Lindblad D, MD No   Tachycardia 08/13/2018 by Conard NovakJackson, Vonetta Foulk D, MD No   Nausea/vomiting in pregnancy 05/15/2018 by Conard NovakJackson, Rubye Strohmeyer D, MD No   Supervision of high risk pregnancy, antepartum 05/04/2018 by Conard NovakJackson, Danene Montijo D, MD No   Abnormal glucose tolerance in pregnancy 09/30/2018 by Conard NovakJackson, Aniken Monestime D, MD 10/10/2018 by Conard NovakJackson, Lynk Marti D, MD       Preterm labor symptoms and general obstetric precautions including but not limited to vaginal bleeding, contractions, leaking of fluid and fetal movement were reviewed in detail with the patient. Please refer to After Visit Summary for other counseling recommendations.   - 3h gtt today - no evidence of PTL.  Continue antibiotics and if no improvement consider hospitalization vs. Further outpatient workup (renal ultrasound, etc).   Return in about 2 days (around 10/07/2018) for Routine Prenatal Appointment ([place]).  Thomasene MohairStephen Rydell Wiegel, MD, Merlinda FrederickFACOG [facility], [facility] 10/10/2018 3:46 PM     ADDENDUM: spoke with patient 2 days later.  She is feeling much better with no back pain, no fevers, chills, and UTI symptoms resolving.  Lower abdominal and pelvic pain improving, too.  She has failed  her 3h gtt and now has a meter. She will text me or message me via MyChart her  results.

## 2018-10-06 ENCOUNTER — Other Ambulatory Visit: Payer: Self-pay | Admitting: Obstetrics and Gynecology

## 2018-10-06 DIAGNOSIS — O24410 Gestational diabetes mellitus in pregnancy, diet controlled: Secondary | ICD-10-CM

## 2018-10-06 DIAGNOSIS — Z3480 Encounter for supervision of other normal pregnancy, unspecified trimester: Secondary | ICD-10-CM

## 2018-10-06 DIAGNOSIS — O24419 Gestational diabetes mellitus in pregnancy, unspecified control: Secondary | ICD-10-CM | POA: Insufficient documentation

## 2018-10-06 LAB — GESTATIONAL GLUCOSE TOLERANCE
GLUCOSE 3 HOUR GTT: 114 mg/dL (ref 65–139)
Glucose, Fasting: 87 mg/dL (ref 65–94)
Glucose, GTT - 1 Hour: 203 mg/dL — ABNORMAL HIGH (ref 65–179)
Glucose, GTT - 2 Hour: 166 mg/dL — ABNORMAL HIGH (ref 65–154)

## 2018-10-07 ENCOUNTER — Encounter: Payer: Self-pay | Admitting: Obstetrics and Gynecology

## 2018-10-10 ENCOUNTER — Encounter: Payer: Self-pay | Admitting: Obstetrics and Gynecology

## 2018-10-10 LAB — POCT WET PREP (WET MOUNT)
PH, VAGINAL: 4
Trichomonas Wet Prep HPF POC: ABSENT

## 2018-10-25 ENCOUNTER — Ambulatory Visit (INDEPENDENT_AMBULATORY_CARE_PROVIDER_SITE_OTHER): Payer: Self-pay | Admitting: Obstetrics and Gynecology

## 2018-10-25 ENCOUNTER — Encounter: Payer: Self-pay | Admitting: Obstetrics and Gynecology

## 2018-10-25 VITALS — BP 126/68 | Wt 163.0 lb

## 2018-10-25 DIAGNOSIS — R000 Tachycardia, unspecified: Secondary | ICD-10-CM

## 2018-10-25 DIAGNOSIS — O24410 Gestational diabetes mellitus in pregnancy, diet controlled: Secondary | ICD-10-CM

## 2018-10-25 DIAGNOSIS — Z3A30 30 weeks gestation of pregnancy: Secondary | ICD-10-CM

## 2018-10-25 DIAGNOSIS — F411 Generalized anxiety disorder: Secondary | ICD-10-CM

## 2018-10-25 DIAGNOSIS — O0990 Supervision of high risk pregnancy, unspecified, unspecified trimester: Secondary | ICD-10-CM

## 2018-10-25 LAB — POCT URINALYSIS DIPSTICK OB
Glucose, UA: NEGATIVE
PROTEIN: NEGATIVE

## 2018-10-25 NOTE — Progress Notes (Signed)
  Routine Prenatal Care Visit  Subjective  Tiffany LoaderStacie Wiley is a 27 y.o. G2P1001 at 6927w2d being seen today for ongoing prenatal care.  She is currently monitored for the following issues for this high-risk pregnancy and has Generalized anxiety disorder; Supervision of high risk pregnancy, antepartum; Nausea/vomiting in pregnancy; Heart palpitations; Tachycardia; UTI (urinary tract infection) during pregnancy; and Gestational diabetes on their problem list.  ----------------------------------------------------------------------------------- Patient reports no complaints.   Contractions: Not present. Vag. Bleeding: None.  Movement: Present. Denies leaking of fluid.  Brings complete BG log.  Most values wnl.  Some elevated fasting, but mostly normal.  Most commonly abnormal is 2h pp dinner. She did not know about her about with the dietician/nutritionist.  She is willing to reschedule. ----------------------------------------------------------------------------------- The following portions of the patient's history were reviewed and updated as appropriate: allergies, current medications, past family history, past medical history, past social history, past surgical history and problem list. Problem list updated.   Objective  Blood pressure 126/68, weight 163 lb (73.9 kg), last menstrual period 03/05/2018. Pregravid weight 160 lb (72.6 kg) Total Weight Gain 3 lb (1.361 kg) Urinalysis: Urine Protein Negative  Urine Glucose Negative  Fetal Status: Fetal Heart Rate (bpm): 135 Fundal Height: 31 cm Movement: Present     General:  Alert, oriented and cooperative. Patient is in no acute distress.  Skin: Skin is warm and dry. No rash noted.   Cardiovascular: Normal heart rate noted  Respiratory: Normal respiratory effort, no problems with respiration noted  Abdomen: Soft, gravid, appropriate for gestational age. Pain/Pressure: Present     Pelvic:  Cervical exam deferred        Extremities: Normal range of  motion.  Edema: None  Mental Status: Normal mood and affect. Normal behavior. Normal judgment and thought content.   Assessment   27 y.o. G2P1001 at 4427w2d by  01/01/2019, by Ultrasound presenting for routine prenatal visit  Plan   Pregnancy # 2 Problems (from 03/05/18 to present)    Problem Noted Resolved   Gestational diabetes 10/06/2018 by Conard NovakJackson, Shenea Giacobbe D, MD No   UTI (urinary tract infection) during pregnancy 09/29/2018 by Conard NovakJackson, Haile Bosler D, MD No   Heart palpitations 08/13/2018 by Conard NovakJackson, Bora Bost D, MD No   Tachycardia 08/13/2018 by Conard NovakJackson, Mistina Coatney D, MD No   Nausea/vomiting in pregnancy 05/15/2018 by Conard NovakJackson, Xai Frerking D, MD No   Supervision of high risk pregnancy, antepartum 05/04/2018 by Conard NovakJackson, Callaway Hailes D, MD No   Abnormal glucose tolerance in pregnancy 09/30/2018 by Conard NovakJackson, Kylar Speelman D, MD 10/10/2018 by Conard NovakJackson, Shevonne Wolf D, MD       Preterm labor symptoms and general obstetric precautions including but not limited to vaginal bleeding, contractions, leaking of fluid and fetal movement were reviewed in detail with the patient. Please refer to After Visit Summary for other counseling recommendations.   - Discussed strategies for improving BG control with need for medication if BG levels worsen.  She is willing to go to see dietician and get GDM counseling. So, will reschedule appointment, especially in light of her borderline control.  Return in about 2 weeks (around 11/08/2018) for Routine Prenatal Appointment.  Thomasene MohairStephen Tashea Othman, MD, Merlinda FrederickFACOG [facility], [facility] 10/25/2018 11:15 AM

## 2018-10-27 NOTE — L&D Delivery Note (Addendum)
Delivery Note At 10:08 PM a viable female was delivered via Vaginal, Spontaneous (Presentation: ROA). APGAR: 8, 9 ; weight pending  .   Placenta status: delivered spontaneously, intact.  Cord: 3VC with the following complications: none.  Cord pH: n/a  Anesthesia:  epidural Episiotomy: None Lacerations: None Suture Repair: n/a Quant. Blood Loss (mL):  680 mL  Mom to postpartum.  Baby to Couplet care / Skin to Skin.  Thomasene Mohair, MD 12/23/2018, 10:28 PM

## 2018-10-28 ENCOUNTER — Encounter: Payer: Self-pay | Attending: Obstetrics and Gynecology | Admitting: *Deleted

## 2018-10-28 ENCOUNTER — Encounter: Payer: Self-pay | Admitting: *Deleted

## 2018-10-28 VITALS — BP 100/62 | Ht 63.0 in | Wt 161.9 lb

## 2018-10-28 DIAGNOSIS — O24410 Gestational diabetes mellitus in pregnancy, diet controlled: Secondary | ICD-10-CM | POA: Insufficient documentation

## 2018-10-28 DIAGNOSIS — Z3A00 Weeks of gestation of pregnancy not specified: Secondary | ICD-10-CM | POA: Insufficient documentation

## 2018-10-28 DIAGNOSIS — Z713 Dietary counseling and surveillance: Secondary | ICD-10-CM | POA: Insufficient documentation

## 2018-10-28 NOTE — Progress Notes (Signed)
Diabetes Self-Management Education  Visit Type: First/Initial  Appt. Start Time: 1315 Appt. End Time: 1440  10/28/2018  Tiffany Wiley, identified by name and date of birth, is a 28 y.o. female with a diagnosis of Diabetes: Gestational Diabetes.   ASSESSMENT  Blood pressure 100/62, height 5\' 3"  (1.6 m), weight 161 lb 14.4 oz (73.4 kg), last menstrual period 03/05/2018. Body mass index is 28.68 kg/m.  Diabetes Self-Management Education - 10/28/18 1447      Visit Information   Visit Type  First/Initial      Initial Visit   Diabetes Type  Gestational Diabetes    Are you currently following a meal plan?  Yes    What type of meal plan do you follow?  decreased carbs    Are you taking your medications as prescribed?  Yes    Date Diagnosed  1 month      Health Coping   How would you rate your overall health?  Fair      Psychosocial Assessment   Patient Belief/Attitude about Diabetes  Motivated to manage diabetes   "no different, little stressful"   Self-care barriers  None    Self-management support  Doctor's office;Family    Patient Concerns  Nutrition/Meal planning;Glycemic Control;Monitoring;Healthy Lifestyle    Special Needs  None    Preferred Learning Style  Auditory;Hands on    Learning Readiness  Change in progress    How often do you need to have someone help you when you read instructions, pamphlets, or other written materials from your doctor or pharmacy?  1 - Never    What is the last grade level you completed in school?  12th - trade school      Pre-Education Assessment   Patient understands the diabetes disease and treatment process.  Needs Instruction    Patient understands incorporating nutritional management into lifestyle.  Needs Instruction    Patient undertands incorporating physical activity into lifestyle.  Needs Instruction    Patient understands using medications safely.  Needs Instruction    Patient understands monitoring blood glucose, interpreting  and using results  Needs Review    Patient understands prevention, detection, and treatment of acute complications.  Needs Instruction    Patient understands prevention, detection, and treatment of chronic complications.  Needs Instruction    Patient understands how to develop strategies to address psychosocial issues.  Needs Instruction    Patient understands how to develop strategies to promote health/change behavior.  Needs Instruction      Complications   How often do you check your blood sugar?  3-4 times/day    Fasting Blood glucose range (mg/dL)  [id number]   FBG's 60-45489-111 mg/dL (7 of 22 high)   Postprandial Blood glucose range (mg/dL)  09-811;914-782;956-21370-129;130-179;180-200   pp's breakfast 83-148 mg/dL (7 of 21 high); pp's lunch 95-131 mg/dL (4 of 21 high); pp's supper 86-188 mg/dL (13 of 21 high)   Have you had a dilated eye exam in the past 12 months?  No    Have you had a dental exam in the past 12 months?  No    Are you checking your feet?  No      Dietary Intake   Breakfast  egg white and cheese with apple wedges; cereal and milk and sometimes fruit    Lunch  grilled chicken wrap on tortilla with lettuce; ham and cheese sandwich on white bread;     Snack (afternoon)  popcorn, fruit - grapes, apple, banana, cheese  Dinner  hot dog with cresent rolls, chicken or pork with green beans, corn, brown rice, sometimes spaghetti, lettuce, carrots, fruit    Snack (evening)  fruit, cheese    Beverage(s)  water. diet soda 1-2 x week; sugar free drink occasionally      Exercise   Exercise Type  ADL's      Patient Education   Previous Diabetes Education  No    Disease state   Definition of diabetes, type 1 and 2, and the diagnosis of diabetes;Factors that contribute to the development of diabetes    Nutrition management   Role of diet in the treatment of diabetes and the relationship between the three main macronutrients and blood glucose level;Reviewed blood glucose goals for pre and post meals and how  to evaluate the patients' food intake on their blood glucose level.    Physical activity and exercise   Role of exercise on diabetes management, blood pressure control and cardiac health.    Monitoring  Purpose and frequency of SMBG.;Taught/discussed recording of test results and interpretation of SMBG.;Ketone testing, when, how.    Chronic complications  Relationship between chronic complications and blood glucose control    Psychosocial adjustment  Identified and addressed patients feelings and concerns about diabetes    Preconception care  Pregnancy and GDM  Role of pre-pregnancy blood glucose control on the development of the fetus;Role of family planning for patients with diabetes;Reviewed with patient blood glucose goals with pregnancy      Individualized Goals (developed by patient)   Reducing Risk  Improve blood sugars Prevent diabetes complications Lead a healthier lifestyle     Outcomes   Expected Outcomes  Demonstrated interest in learning. Expect positive outcomes    Future DMSE  Other (comment)   Pt doesn't have insurance. She will call back if she needs to see a dietitian.    Program Status  Not Completed       Individualized Plan for Diabetes Self-Management Training:   Learning Objective:  Patient will have a greater understanding of diabetes self-management. Patient education plan is to attend individual and/or group sessions per assessed needs and concerns.   Plan:   Patient Instructions  Read booklet on Gestational Diabetes Follow Gestational Meal Planning Guidelines Avoid cold cereal for breakfast Limit fruit at bedtime Include 1 serving of protein and 1 serving of carbohydrate for snack Check blood sugars 4 x day - before breakfast and 2 hrs after every meal and record  Bring blood sugar log to all appointments Purchase urine ketone strips if ordered by MD and check urine ketones every am:  If + increase bedtime snack to 1 protein and 2 carbohydrate  servings Walk 20-30 minutes at least 5 x week if permitted by MD Call back if you want to see a dietitian  Expected Outcomes:  Demonstrated interest in learning. Expect positive outcomes  Education material provided:  Gestational Booklet Gestational Meal Planning Guidelines Simple Meal Plan Planning a Balanced Meal Food Safety Handout Viewed Gestational Diabetes Video Goals for a Healthy Pregnancy  If problems or questions, patient to contact team via:  Sharion Settler, RN, CCM, CDE [phone]  Future DSME appointment: (Pt doesn't have insurance. She will call back if she needs to see a dietitian. )

## 2018-10-28 NOTE — Patient Instructions (Signed)
Read booklet on Gestational Diabetes Follow Gestational Meal Planning Guidelines Avoid cold cereal for breakfast Limit fruit at bedtime Include 1 serving of protein and 1 serving of carbohydrate for snack Check blood sugars 4 x day - before breakfast and 2 hrs after every meal and record  Bring blood sugar log to all appointments Purchase urine ketone strips if ordered by MD and check urine ketones every am:  If + increase bedtime snack to 1 protein and 2 carbohydrate servings Walk 20-30 minutes at least 5 x week if permitted by MD Call back if you want to see a dietitian

## 2018-11-09 ENCOUNTER — Ambulatory Visit (INDEPENDENT_AMBULATORY_CARE_PROVIDER_SITE_OTHER): Payer: Self-pay | Admitting: Obstetrics and Gynecology

## 2018-11-09 ENCOUNTER — Encounter: Payer: Self-pay | Admitting: Obstetrics and Gynecology

## 2018-11-09 VITALS — BP 124/80 | Wt 163.0 lb

## 2018-11-09 DIAGNOSIS — Z23 Encounter for immunization: Secondary | ICD-10-CM

## 2018-11-09 DIAGNOSIS — O0990 Supervision of high risk pregnancy, unspecified, unspecified trimester: Secondary | ICD-10-CM

## 2018-11-09 DIAGNOSIS — Z3A32 32 weeks gestation of pregnancy: Secondary | ICD-10-CM

## 2018-11-09 DIAGNOSIS — O24410 Gestational diabetes mellitus in pregnancy, diet controlled: Secondary | ICD-10-CM

## 2018-11-09 DIAGNOSIS — O99343 Other mental disorders complicating pregnancy, third trimester: Secondary | ICD-10-CM

## 2018-11-09 DIAGNOSIS — O2343 Unspecified infection of urinary tract in pregnancy, third trimester: Secondary | ICD-10-CM

## 2018-11-09 DIAGNOSIS — R002 Palpitations: Secondary | ICD-10-CM

## 2018-11-09 DIAGNOSIS — F411 Generalized anxiety disorder: Secondary | ICD-10-CM

## 2018-11-09 DIAGNOSIS — O9989 Other specified diseases and conditions complicating pregnancy, childbirth and the puerperium: Secondary | ICD-10-CM

## 2018-11-09 NOTE — Addendum Note (Signed)
Addended by: Liliane ShiSHAW, Krystle Polcyn M on: 11/09/2018 02:47 PM   Modules accepted: Orders

## 2018-11-09 NOTE — Progress Notes (Signed)
  Routine Prenatal Care Visit  Subjective  Tiffany Wiley is a 28 y.o. G2P1001 at [redacted]w[redacted]d being seen today for ongoing prenatal care.  She is currently monitored for the following issues for this high-risk pregnancy and has Generalized anxiety disorder; Supervision of high risk pregnancy, antepartum; Nausea/vomiting in pregnancy; Heart palpitations; Tachycardia; UTI (urinary tract infection) during pregnancy; and Gestational diabetes on their problem list.  ----------------------------------------------------------------------------------- Patient reports no complaints.   Contractions: Not present. Vag. Bleeding: None.  Movement: Present. Denies leaking of fluid.  GDM: Pt brings complete BG log.  Greater than 50% of values within normal range. Last couple days have been out of range.  Discussed diet and exercise.  ----------------------------------------------------------------------------------- The following portions of the patient's history were reviewed and updated as appropriate: allergies, current medications, past family history, past medical history, past social history, past surgical history and problem list. Problem list updated.   Objective  Blood pressure 124/80, weight 163 lb (73.9 kg), last menstrual period 03/05/2018. Pregravid weight 160 lb (72.6 kg) Total Weight Gain 3 lb (1.361 kg) Urinalysis: Urine Protein    Urine Glucose    Fetal Status: Fetal Heart Rate (bpm): 150 Fundal Height: 33 cm Movement: Present     General:  Alert, oriented and cooperative. Patient is in no acute distress.  Skin: Skin is warm and dry. No rash noted.   Cardiovascular: Normal heart rate noted  Respiratory: Normal respiratory effort, no problems with respiration noted  Abdomen: Soft, gravid, appropriate for gestational age. Pain/Pressure: Absent     Pelvic:  Cervical exam deferred        Extremities: Normal range of motion.  Edema: None  Mental Status: Normal mood and affect. Normal behavior. Normal  judgment and thought content.   Assessment   28 y.o. G2P1001 at [redacted]w[redacted]d by  01/01/2019, by Ultrasound presenting for routine prenatal visit  Plan   Pregnancy # 2 Problems (from 03/05/18 to present)    Problem Noted Resolved   Gestational diabetes 10/06/2018 by Conard Novak, MD No   UTI (urinary tract infection) during pregnancy 09/29/2018 by Conard Novak, MD No   Heart palpitations 08/13/2018 by Conard Novak, MD No   Tachycardia 08/13/2018 by Conard Novak, MD No   Nausea/vomiting in pregnancy 05/15/2018 by Conard Novak, MD No   Supervision of high risk pregnancy, antepartum 05/04/2018 by Conard Novak, MD No   Abnormal glucose tolerance in pregnancy 09/30/2018 by Conard Novak, MD 10/10/2018 by Conard Novak, MD       Preterm labor symptoms and general obstetric precautions including but not limited to vaginal bleeding, contractions, leaking of fluid and fetal movement were reviewed in detail with the patient. Please refer to After Visit Summary for other counseling recommendations.   - Discussed potential need for starting insulin, if blood glucose level remain elevated > 50% of time. U/s in 2 weeks for growth with AFI.   - TDaP today  Return in about 1 week (around 11/16/2018) for 1 week ROB, 2 weeks Growth u/s with ROB after both with Dr Jean Rosenthal (may double book).  Thomasene Mohair, MD, [facility], [facility] 11/09/2018 11:53 AM

## 2018-11-17 ENCOUNTER — Ambulatory Visit (INDEPENDENT_AMBULATORY_CARE_PROVIDER_SITE_OTHER): Payer: Self-pay | Admitting: Obstetrics and Gynecology

## 2018-11-17 VITALS — BP 122/84 | Wt 166.0 lb

## 2018-11-17 DIAGNOSIS — O0990 Supervision of high risk pregnancy, unspecified, unspecified trimester: Secondary | ICD-10-CM

## 2018-11-17 DIAGNOSIS — Z3A33 33 weeks gestation of pregnancy: Secondary | ICD-10-CM

## 2018-11-17 DIAGNOSIS — R002 Palpitations: Secondary | ICD-10-CM

## 2018-11-17 DIAGNOSIS — F411 Generalized anxiety disorder: Secondary | ICD-10-CM

## 2018-11-17 DIAGNOSIS — O24415 Gestational diabetes mellitus in pregnancy, controlled by oral hypoglycemic drugs: Secondary | ICD-10-CM

## 2018-11-17 DIAGNOSIS — O9989 Other specified diseases and conditions complicating pregnancy, childbirth and the puerperium: Secondary | ICD-10-CM

## 2018-11-17 MED ORDER — METFORMIN HCL 500 MG PO TABS: 500.0000 mg | ORAL_TABLET | Freq: Two times a day (BID) | ORAL | 2 refills | Status: DC

## 2018-11-17 NOTE — Progress Notes (Signed)
  Routine Prenatal Care Visit  Subjective  Tiffany Wiley is a 28 y.o. G2P1001 at [redacted]w[redacted]d being seen today for ongoing prenatal care.  She is currently monitored for the following issues for this high-risk pregnancy and has Generalized anxiety disorder; Supervision of high risk pregnancy, antepartum; Nausea/vomiting in pregnancy; Heart palpitations; Tachycardia; UTI (urinary tract infection) during pregnancy; and Gestational diabetes on their problem list.  ----------------------------------------------------------------------------------- Patient reports no complaints.   Contractions: Not present. Vag. Bleeding: None.  Movement: Present. Denies leaking of fluid.  GDM: patient brings a complete log of blood glucose values with her. She has > 50% of fasting > 95.  She has well-less-than 50% of her postprandial values > 120.  She has had a pattern of elevated 2 h pp after dinner. However, in recent days, they are mostly normal.   ----------------------------------------------------------------------------------- The following portions of the patient's history were reviewed and updated as appropriate: allergies, current medications, past family history, past medical history, past social history, past surgical history and problem list. Problem list updated.   Objective  Blood pressure 122/84, weight 166 lb (75.3 kg), last menstrual period 03/05/2018. Pregravid weight 160 lb (72.6 kg) Total Weight Gain 6 lb (2.722 kg) Urinalysis: Urine Protein    Urine Glucose    Fetal Status: Fetal Heart Rate (bpm): 140 Fundal Height: 33 cm Movement: Present     General:  Alert, oriented and cooperative. Patient is in no acute distress.  Skin: Skin is warm and dry. No rash noted.   Cardiovascular: Normal heart rate noted  Respiratory: Normal respiratory effort, no problems with respiration noted  Abdomen: Soft, gravid, appropriate for gestational age. Pain/Pressure: Absent     Pelvic:  Cervical exam deferred         Extremities: Normal range of motion.  Edema: None  Mental Status: Normal mood and affect. Normal behavior. Normal judgment and thought content.   Assessment   28 y.o. G2P1001 at [redacted]w[redacted]d by  01/01/2019, by Ultrasound presenting for routine prenatal visit  Plan   Pregnancy # 2 Problems (from 03/05/18 to present)    Problem Noted Resolved   Gestational diabetes 10/06/2018 by Conard Novak, MD No   UTI (urinary tract infection) during pregnancy 09/29/2018 by Conard Novak, MD No   Heart palpitations 08/13/2018 by Conard Novak, MD No   Tachycardia 08/13/2018 by Conard Novak, MD No   Nausea/vomiting in pregnancy 05/15/2018 by Conard Novak, MD No   Supervision of high risk pregnancy, antepartum 05/04/2018 by Conard Novak, MD No   Abnormal glucose tolerance in pregnancy 09/30/2018 by Conard Novak, MD 10/10/2018 by Conard Novak, MD       Preterm labor symptoms and general obstetric precautions including but not limited to vaginal bleeding, contractions, leaking of fluid and fetal movement were reviewed in detail with the patient. Please refer to After Visit Summary for other counseling recommendations.   - will start metformin 500mg  po bid for control of blood glucose. Will increase monitoring and monitor BG values. Discussed potential side effects of metformin.   Return in about 2 weeks (around 12/01/2018) for Routine Prenatal Appointment/NST (keep other appts).  Thomasene Mohair, MD, [facility], [facility] 11/17/2018 5:41 PM

## 2018-11-18 ENCOUNTER — Encounter: Payer: Self-pay | Admitting: Obstetrics and Gynecology

## 2018-11-23 ENCOUNTER — Ambulatory Visit (INDEPENDENT_AMBULATORY_CARE_PROVIDER_SITE_OTHER): Payer: Self-pay | Admitting: Obstetrics and Gynecology

## 2018-11-23 ENCOUNTER — Encounter: Payer: Self-pay | Admitting: Obstetrics and Gynecology

## 2018-11-23 ENCOUNTER — Ambulatory Visit (INDEPENDENT_AMBULATORY_CARE_PROVIDER_SITE_OTHER): Payer: Self-pay

## 2018-11-23 VITALS — BP 124/74 | Wt 164.0 lb

## 2018-11-23 DIAGNOSIS — F411 Generalized anxiety disorder: Secondary | ICD-10-CM

## 2018-11-23 DIAGNOSIS — O99343 Other mental disorders complicating pregnancy, third trimester: Secondary | ICD-10-CM

## 2018-11-23 DIAGNOSIS — Z3A34 34 weeks gestation of pregnancy: Secondary | ICD-10-CM

## 2018-11-23 DIAGNOSIS — O24410 Gestational diabetes mellitus in pregnancy, diet controlled: Secondary | ICD-10-CM

## 2018-11-23 DIAGNOSIS — O0990 Supervision of high risk pregnancy, unspecified, unspecified trimester: Secondary | ICD-10-CM

## 2018-11-23 DIAGNOSIS — O24415 Gestational diabetes mellitus in pregnancy, controlled by oral hypoglycemic drugs: Secondary | ICD-10-CM

## 2018-11-23 DIAGNOSIS — O2343 Unspecified infection of urinary tract in pregnancy, third trimester: Secondary | ICD-10-CM

## 2018-11-23 DIAGNOSIS — Z362 Encounter for other antenatal screening follow-up: Secondary | ICD-10-CM

## 2018-11-23 NOTE — Progress Notes (Signed)
Routine Prenatal Care Visit  Subjective  Arsenio LoaderStacie Sprigg is a 28 y.o. G2P1001 at 4425w3d being seen today for ongoing prenatal care.  She is currently monitored for the following issues for this high-risk pregnancy and has Generalized anxiety disorder; Supervision of high risk pregnancy, antepartum; Nausea/vomiting in pregnancy; Heart palpitations; Tachycardia; UTI (urinary tract infection) during pregnancy; and Gestational diabetes on their problem list.  ----------------------------------------------------------------------------------- Patient reports no complaints.   Contractions: Not present. Vag. Bleeding: None.  Movement: Present. Denies leaking of fluid.  U/S: growth 60th%ile, AFI 9.7 cm, cephalic Bring BG log. Improved fasting (taking metformin 500 mg QHS),   PP values still elevated, Will add metformin 500 mg QAM) ----------------------------------------------------------------------------------- The following portions of the patient's history were reviewed and updated as appropriate: allergies, current medications, past family history, past medical history, past social history, past surgical history and problem list. Problem list updated.   Objective  Blood pressure 124/74, weight 164 lb (74.4 kg), last menstrual period 03/05/2018. Pregravid weight 160 lb (72.6 kg) Total Weight Gain 4 lb (1.814 kg) Urinalysis: Urine Protein    Urine Glucose    Fetal Status: Fetal Heart Rate (bpm): present   Movement: Present     General:  Alert, oriented and cooperative. Patient is in no acute distress.  Skin: Skin is warm and dry. No rash noted.   Cardiovascular: Normal heart rate noted  Respiratory: Normal respiratory effort, no problems with respiration noted  Abdomen: Soft, gravid, appropriate for gestational age. Pain/Pressure: Absent     Pelvic:  Cervical exam deferred        Extremities: Normal range of motion.  Edema: None  Mental Status: Normal mood and affect. Normal behavior. Normal  judgment and thought content.   Imaging Results [facility] Up  Result Date: 11/23/2018 Patient Name: Arsenio LoaderStacie Lupinacci DOB: 02/12/1991 MRN: [id number] ULTRASOUND REPORT Location: [facility] Date of Service: 11/23/2018 Indications:growth/afi Findings: Mason JimSingleton intrauterine pregnancy is visualized with FHR at 144 BPM. Biometrics give an (U/S) Gestational age of 583w5d and an (U/S) EDD of 12/30/18; this correlates with the clinically established Estimated Date of Delivery: 01/01/19. Fetal presentation is Cephalic. Placenta: posterior. Grade: 1 AFI: 9.7 cm Growth percentile is 60.6. EFW: 2,646 grams (5 lbs 13 oz) Mildly dilated left renal pelvis at 7.874mm. Impression: 1. 6125w3d Viable Singleton Intrauterine pregnancy previously established criteria. 2. Growth is 60.6 %ile (FL measuring 2 weeks ahead, BPD measuring 11 days behind).  AFI is 9.7 cm. 3. Left fetal pyelectasis at 7.244mm. Darlina GuysAbby M Clarke, RDMS RVT The ultrasound images and findings were reviewed by me and I agree with the above report. Thomasene MohairStephen Yocelin Vanlue, MD, [facility]inda FrederickFACOG [facility], [facility] 11/23/2018 11:56 AM      Assessment   27 y.o. G2P1001 at 8125w3d by  01/01/2019, by Ultrasound presenting for routine prenatal visit  Plan   Pregnancy # 2 Problems (from 03/05/18 to present)    Problem Noted Resolved   Gestational diabetes 10/06/2018 by Conard NovakJackson, Shuntel Fishburn D, MD No   UTI (urinary tract infection) during pregnancy 09/29/2018 by Conard NovakJackson, Craigory Toste D, MD No   Heart palpitations 08/13/2018 by Conard NovakJackson, Chas Axel D, MD No   Tachycardia 08/13/2018 by Conard NovakJackson, Jayanna Kroeger D, MD No   Nausea/vomiting in pregnancy 05/15/2018 by Conard NovakJackson, Shakil Dirk D, MD No   Supervision of high risk pregnancy, antepartum 05/04/2018 by Conard NovakJackson, [place] Kirchner D, MD No   Abnormal glucose tolerance in pregnancy 09/30/2018 by Conard NovakJackson, Janan Bogie D, MD 10/10/2018 by Conard NovakJackson, Nolan Tuazon D, MD  Preterm labor symptoms and general obstetric precautions including but not limited to vaginal  bleeding, contractions, leaking of fluid and fetal movement were reviewed in detail with the patient. Please refer to After Visit Summary for other counseling recommendations.   - add metformin 500 mg QAM - send BG to me by text in 4 days, will consider starting insulin, if uncontrolled.   Return in about 1 week (around 11/30/2018) for Routine Prenatal Appointment/NST with DR. Jean Rosenthal (may over/doouble book).  Thomasene Mohair, MD, [facility], [facility] 11/23/2018 12:15 PM

## 2018-11-29 ENCOUNTER — Ambulatory Visit (INDEPENDENT_AMBULATORY_CARE_PROVIDER_SITE_OTHER): Payer: Self-pay | Admitting: Obstetrics and Gynecology

## 2018-11-29 VITALS — BP 122/78 | Wt 164.0 lb

## 2018-11-29 DIAGNOSIS — F411 Generalized anxiety disorder: Secondary | ICD-10-CM

## 2018-11-29 DIAGNOSIS — O99343 Other mental disorders complicating pregnancy, third trimester: Secondary | ICD-10-CM

## 2018-11-29 DIAGNOSIS — Z3A35 35 weeks gestation of pregnancy: Secondary | ICD-10-CM

## 2018-11-29 DIAGNOSIS — O0990 Supervision of high risk pregnancy, unspecified, unspecified trimester: Secondary | ICD-10-CM

## 2018-11-29 DIAGNOSIS — O24415 Gestational diabetes mellitus in pregnancy, controlled by oral hypoglycemic drugs: Secondary | ICD-10-CM

## 2018-11-29 NOTE — Progress Notes (Signed)
Leaking of clear fluid for 2 days. A lot of cramping since Friday.

## 2018-11-29 NOTE — Progress Notes (Signed)
  Routine Prenatal Care Visit  Subjective  Tiffany Wiley is a 28 y.o. G2P1001 at [redacted]w[redacted]d being seen today for ongoing prenatal care.  She is currently monitored for the following issues for this high-risk pregnancy and has Generalized anxiety disorder; Supervision of high risk pregnancy, antepartum; Nausea/vomiting in pregnancy; Heart palpitations; Tachycardia; UTI (urinary tract infection) during pregnancy; and Gestational diabetes on their problem list.  ----------------------------------------------------------------------------------- Patient reports watery discharge since 3 days ago.  No gush of fluid, but sometimes soaking through her clothes. Clear in color.  No bleeding.  Just a little on the pad this morning.   Contractions: Not present. Vag. Bleeding: None.  Movement: Present. Denies leaking of fluid.  ----------------------------------------------------------------------------------- The following portions of the patient's history were reviewed and updated as appropriate: allergies, current medications, past family history, past medical history, past social history, past surgical history and problem list. Problem list updated.   Objective  Blood pressure 122/78, weight 164 lb (74.4 kg), last menstrual period 03/05/2018. Pregravid weight 160 lb (72.6 kg) Total Weight Gain 4 lb (1.814 kg) Urinalysis: Urine Protein    Urine Glucose    Fetal Status: Fetal Heart Rate (bpm): 140 Fundal Height: 36 cm Movement: Present  Presentation: Vertex  General:  Alert, oriented and cooperative. Patient is in no acute distress.  Skin: Skin is warm and dry. No rash noted.   Cardiovascular: Normal heart rate noted  Respiratory: Normal respiratory effort, no problems with respiration noted  Abdomen: Soft, gravid, appropriate for gestational age. Pain/Pressure: Present     Pelvic:  Cervical exam performed Dilation: Closed Effacement (%): 40 Station: -3  Extremities: Normal range of motion.  Edema: None    Mental Status: Normal mood and affect. Normal behavior. Normal judgment and thought content.   Nitrazine: negative Pooling: negative Ferning: negative  BSUS:  SLIUP in cephalic presentaion. MVP 4.8 cm.    Assessment   28 y.o. G2P1001 at [redacted]w[redacted]d by  01/01/2019, by Ultrasound presenting for work-in prenatal visit  Plan   Pregnancy # 2 Problems (from 03/05/18 to present)    Problem Noted Resolved   Gestational diabetes 10/06/2018 by Conard Novak, MD No   UTI (urinary tract infection) during pregnancy 09/29/2018 by Conard Novak, MD No   Heart palpitations 08/13/2018 by Conard Novak, MD No   Tachycardia 08/13/2018 by Conard Novak, MD No   Nausea/vomiting in pregnancy 05/15/2018 by Conard Novak, MD No   Supervision of high risk pregnancy, antepartum 05/04/2018 by Conard Novak, MD No   Abnormal glucose tolerance in pregnancy 09/30/2018 by Conard Novak, MD 10/10/2018 by Conard Novak, MD       Preterm labor symptoms and general obstetric precautions including but not limited to vaginal bleeding, contractions, leaking of fluid and fetal movement were reviewed in detail with the patient. Please refer to After Visit Summary for other counseling recommendations.   - no evidence of ROM today.  Precautions for bleeding, continued leaking of fluid. Pain, fever, etc.   - Amniotic fluid by ultrasound: normal.   - GBS collected today.   Return in about 3 days (around 12/02/2018) for Keep previously scheduled appt. Thomasene Mohair, MD, [facility], [facility] 11/29/2018 9:19 AM

## 2018-12-01 LAB — STREP GP B NAA: STREP GROUP B AG: NEGATIVE

## 2018-12-02 ENCOUNTER — Encounter: Payer: Self-pay | Admitting: Obstetrics and Gynecology

## 2018-12-02 ENCOUNTER — Ambulatory Visit (INDEPENDENT_AMBULATORY_CARE_PROVIDER_SITE_OTHER): Payer: Self-pay | Admitting: Obstetrics and Gynecology

## 2018-12-02 VITALS — BP 124/74 | Wt 164.0 lb

## 2018-12-02 DIAGNOSIS — F411 Generalized anxiety disorder: Secondary | ICD-10-CM

## 2018-12-02 DIAGNOSIS — O0990 Supervision of high risk pregnancy, unspecified, unspecified trimester: Secondary | ICD-10-CM

## 2018-12-02 DIAGNOSIS — Z3A35 35 weeks gestation of pregnancy: Secondary | ICD-10-CM

## 2018-12-02 DIAGNOSIS — O99343 Other mental disorders complicating pregnancy, third trimester: Secondary | ICD-10-CM

## 2018-12-02 DIAGNOSIS — O24414 Gestational diabetes mellitus in pregnancy, insulin controlled: Secondary | ICD-10-CM

## 2018-12-02 MED ORDER — INSULIN NPH (HUMAN) (ISOPHANE) 100 UNIT/ML ~~LOC~~ SUSP: 10.0000 [IU] | Freq: Two times a day (BID) | SUBCUTANEOUS | 3 refills | Status: DC

## 2018-12-02 NOTE — Progress Notes (Signed)
  Routine Prenatal Care Visit  Subjective  Tiffany Wiley is a 28 y.o. G2P1001 at [redacted]w[redacted]d being seen today for ongoing prenatal care.  She is currently monitored for the following issues for this high-risk pregnancy and has Generalized anxiety disorder; Supervision of high risk pregnancy, antepartum; Nausea/vomiting in pregnancy; Heart palpitations; Tachycardia; UTI (urinary tract infection) during pregnancy; and Gestational diabetes on their problem list.  ----------------------------------------------------------------------------------- Patient reports no complaints.   Contractions: Not present.  .  Movement: Present. Denies leaking of fluid.  BG log shows mostly elevated BG values.   ----------------------------------------------------------------------------------- The following portions of the patient's history were reviewed and updated as appropriate: allergies, current medications, past family history, past medical history, past social history, past surgical history and problem list. Problem list updated.   Objective  Blood pressure 124/74, weight 164 lb (74.4 kg), last menstrual period 03/05/2018. Pregravid weight 160 lb (72.6 kg) Total Weight Gain 4 lb (1.814 kg) Urinalysis: Urine Protein    Urine Glucose    Fetal Status:     Movement: Present     General:  Alert, oriented and cooperative. Patient is in no acute distress.  Skin: Skin is warm and dry. No rash noted.   Cardiovascular: Normal heart rate noted  Respiratory: Normal respiratory effort, no problems with respiration noted  Abdomen: Soft, gravid, appropriate for gestational age. Pain/Pressure: Present     Pelvic:  Cervical exam deferred        Extremities: Normal range of motion.     Mental Status: Normal mood and affect. Normal behavior. Normal judgment and thought content.   Bedside u/s  BPP 8/8, MVP 5 cm  Assessment   27 y.o. G2P1001 at [redacted]w[redacted]d by  01/01/2019, by Ultrasound presenting for routine prenatal visit  Plan    Pregnancy # 2 Problems (from 03/05/18 to present)    Problem Noted Resolved   Gestational diabetes 10/06/2018 by Conard Novak, MD No   UTI (urinary tract infection) during pregnancy 09/29/2018 by Conard Novak, MD No   Heart palpitations 08/13/2018 by Conard Novak, MD No   Tachycardia 08/13/2018 by Conard Novak, MD No   Nausea/vomiting in pregnancy 05/15/2018 by Conard Novak, MD No   Supervision of high risk pregnancy, antepartum 05/04/2018 by Conard Novak, MD No   Abnormal glucose tolerance in pregnancy 09/30/2018 by Conard Novak, MD 10/10/2018 by Conard Novak, MD       Preterm labor symptoms and general obstetric precautions including but not limited to vaginal bleeding, contractions, leaking of fluid and fetal movement were reviewed in detail with the patient. Please refer to After Visit Summary for other counseling recommendations.   - start NPH 10 units twice daily and let me know every day or two how her BG values are running. Will make adjustments as needed.  She states her husband knows how to administer. If not, I will send her to lifestyles.   Return in about 4 days (around 12/06/2018) for ROB/NST with Dr. Jean Rosenthal .  Thomasene Mohair, MD, [facility], [facility] 12/02/2018 12:39 PM

## 2018-12-05 ENCOUNTER — Observation Stay
Admission: EM | Admit: 2018-12-05 | Discharge: 2018-12-06 | Disposition: A | Discharge status: Home / Self Care | Payer: Medicaid Other | Attending: Obstetrics and Gynecology | Admitting: Obstetrics and Gynecology

## 2018-12-05 ENCOUNTER — Other Ambulatory Visit: Payer: Self-pay

## 2018-12-05 DIAGNOSIS — R000 Tachycardia, unspecified: Secondary | ICD-10-CM

## 2018-12-05 DIAGNOSIS — O24414 Gestational diabetes mellitus in pregnancy, insulin controlled: Secondary | ICD-10-CM | POA: Insufficient documentation

## 2018-12-05 DIAGNOSIS — O26893 Other specified pregnancy related conditions, third trimester: Principal | ICD-10-CM | POA: Insufficient documentation

## 2018-12-05 DIAGNOSIS — O0990 Supervision of high risk pregnancy, unspecified, unspecified trimester: Secondary | ICD-10-CM

## 2018-12-05 DIAGNOSIS — R002 Palpitations: Secondary | ICD-10-CM

## 2018-12-05 DIAGNOSIS — Z3A35 35 weeks gestation of pregnancy: Secondary | ICD-10-CM

## 2018-12-05 DIAGNOSIS — O2343 Unspecified infection of urinary tract in pregnancy, third trimester: Secondary | ICD-10-CM

## 2018-12-05 DIAGNOSIS — N898 Other specified noninflammatory disorders of vagina: Secondary | ICD-10-CM | POA: Insufficient documentation

## 2018-12-05 DIAGNOSIS — O219 Vomiting of pregnancy, unspecified: Secondary | ICD-10-CM

## 2018-12-05 DIAGNOSIS — Z3A36 36 weeks gestation of pregnancy: Secondary | ICD-10-CM | POA: Insufficient documentation

## 2018-12-05 NOTE — OB Triage Note (Signed)
Pt presents to L&D with c/o leaking fluid since last week. Today pt reported feeling larger gush at 2030. Pt denies vaginal bleeding, contractions, and reports good fetal movement. EFM applied and explained, plan to monitor for ROM and fetal well being

## 2018-12-06 DIAGNOSIS — N898 Other specified noninflammatory disorders of vagina: Secondary | ICD-10-CM | POA: Diagnosis present

## 2018-12-06 DIAGNOSIS — O24414 Gestational diabetes mellitus in pregnancy, insulin controlled: Secondary | ICD-10-CM | POA: Diagnosis not present

## 2018-12-06 DIAGNOSIS — Z3A36 36 weeks gestation of pregnancy: Secondary | ICD-10-CM

## 2018-12-06 DIAGNOSIS — O26893 Other specified pregnancy related conditions, third trimester: Secondary | ICD-10-CM | POA: Diagnosis not present

## 2018-12-06 NOTE — Final Progress Note (Signed)
Physician Final Progress Note  Patient ID: Tiffany LoaderStacie Pherigo MRN: [id number] DOB/AGE: 29/03/1991 28 y.o.  Admit date: 12/05/2018 Admitting provider: Conard NovakStephen D Lindell Tussey, MD Discharge date: 12/06/2018   Admission Diagnoses:  1) intrauterine pregnancy at 4872w2d  2) concern for leakage of fluid  Discharge Diagnoses:  1) intrauterine pregnancy at 5072w2d  2) concern for leakage of fluid - no evidence of rupture of membranes  History of Present Illness: The patient is a 28 y.o. female G2P1001 at 7572w2d who presents for concern for rupture of membranes.  She had a gush of fluid at 0830.  The fluid was clear.  She denies any vaginal bleeding or pink-tinged fluid.  She notes +FM. She denies contractions.   Past Medical History:  Diagnosis Date  . Gestational diabetes   . Medical history non-contributory   . Urinary tract infection     Past Surgical History:  Procedure Laterality Date  . NO PAST SURGERIES      No current facility-administered medications on file prior to encounter.    Current Outpatient Medications on File Prior to Encounter  Medication Sig Dispense Refill  . Prenatal Vit-Fe Fumarate-FA (PRENATAL MULTIVITAMIN) TABS tablet Take 1 tablet by mouth daily at 12 noon.    . Doxylamine-Pyridoxine ER (BONJESTA) 20-20 MG TBCR Take 1 tablet by mouth 2 (two) times daily. (Patient not taking: Reported on 10/25/2018) 60 tablet 3  . insulin NPH Human (HUMULIN N,NOVOLIN N) 100 UNIT/ML injection Inject 0.15 mLs (15 Units total) into the skin 2 (two) times daily before a meal.    . metFORMIN (GLUCOPHAGE) 500 MG tablet Take 1 tablet (500 mg total) by mouth 2 (two) times daily with a meal. (Patient not taking: Reported on 12/05/2018) 60 tablet 2  . nitrofurantoin, macrocrystal-monohydrate, (MACROBID) 100 MG capsule Take 1 capsule (100 mg total) by mouth at bedtime. (Patient not taking: Reported on 12/05/2018) 30 capsule 5    Allergies  Allergen Reactions  . Other Hives, Shortness Of Breath and Swelling     Blueberries  . Shellfish Allergy Hives and Swelling    Social History   Socioeconomic History  . Marital status: Married    Spouse name: Not on file  . Number of children: Not on file  . Years of education: Not on file  . Highest education level: Not on file  Occupational History  . Not on file  Social Needs  . Financial resource strain: Not on file  . Food insecurity:    Worry: Not on file    Inability: Not on file  . Transportation needs:    Medical: Not on file    Non-medical: Not on file  Tobacco Use  . Smoking status: Never Smoker  . Smokeless tobacco: Never Used  Substance and Sexual Activity  . Alcohol use: No  . Drug use: No  . Sexual activity: Yes    Birth control/protection: Condom  Lifestyle  . Physical activity:    Days per week: Not on file    Minutes per session: Not on file  . Stress: Not on file  Relationships  . Social connections:    Talks on phone: Not on file    Gets together: Not on file    Attends religious service: Not on file    Active member of club or organization: Not on file    Attends meetings of clubs or organizations: Not on file    Relationship status: Not on file  . Intimate partner violence:    Fear of current or  ex partner: Not on file    Emotionally abused: Not on file    Physically abused: Not on file    Forced sexual activity: Not on file  Other Topics Concern  . Not on file  Social History Narrative  . Not on file    Family History  Problem Relation Age of Onset  . Breast cancer Other   . Healthy Mother      Review of Systems  Constitutional: Negative.   HENT: Negative.   Eyes: Negative.   Respiratory: Negative.   Cardiovascular: Negative.   Gastrointestinal: Negative.   Genitourinary: Negative.   Musculoskeletal: Negative.   Skin: Negative.   Neurological: Negative.   Psychiatric/Behavioral: Negative.      Physical Exam: BP 127/85   Pulse 99   Temp 98.3 F (36.8 C) (Oral)   Resp 18   LMP  03/05/2018   Physical Exam Constitutional:      General: She is not in acute distress.    Appearance: Normal appearance.  Genitourinary:     Pelvic exam was performed with patient supine.     Vulva, urethra and bladder normal.  HENT:     Head: Normocephalic and atraumatic.  Eyes:     General: No scleral icterus.    Conjunctiva/sclera: Conjunctivae normal.  Abdominal:     Comments: Gravid, NT  Neurological:     General: No focal deficit present.     Mental Status: She is alert and oriented to person, place, and time.     Cranial Nerves: No cranial nerve deficit.  Psychiatric:        Mood and Affect: Mood normal.        Behavior: Behavior normal.        Judgment: Judgment normal.   Female chaperone present for pelvic exam:   Consults: None  Significant Findings/ Diagnostic Studies:  ROM workup Pool: negative Ferning: negtive Nitrazine: not available  Procedures: NST Baseline FHR: 130 beats/min Variability: moderate Accelerations: present Decelerations: absent Tocometry: infrequent  Interpretation:  INDICATIONS: Concern for rupture of membranes RESULTS:  A NST procedure was performed with FHR monitoring and a normal baseline established, appropriate time of 20-40 minutes of evaluation, and accels >2 seen w 15x15 characteristics.  Results show a REACTIVE NST.    Hospital Course: The patient was admitted to Labor and Delivery Triage for observation. She had the above complaint and history. Her vitals were normal. The fetal tracing was reassuring.  The testing for rupture of membranes was negative.  She was reassured and discharged in stable condition.   Discharge Condition: stable  Disposition: Discharge disposition: 01-Home or Self Care       Diet: Regular diet  Discharge Activity: Activity as tolerated   Allergies as of 12/06/2018      Reactions   Other Hives, Shortness Of Breath, Swelling   Blueberries   Shellfish Allergy Hives, Swelling       Medication List    STOP taking these medications   Doxylamine-Pyridoxine ER 20-20 MG Tbcr Commonly known as:  BONJESTA   metFORMIN 500 MG tablet Commonly known as:  GLUCOPHAGE   nitrofurantoin (macrocrystal-monohydrate) 100 MG capsule Commonly known as:  MACROBID     TAKE these medications   insulin NPH Human 100 UNIT/ML injection Commonly known as:  HUMULIN N,NOVOLIN N Inject 0.15 mLs (15 Units total) into the skin 2 (two) times daily before a meal.   prenatal multivitamin Tabs tablet Take 1 tablet by mouth daily at 12 noon.  Follow-up Information    Conard Novak, MD. Go on 12/08/2018.   Specialty:  Obstetrics and Gynecology Why:  Keep previously scheduled appointment Contact information: [address] [place] [address] [phone]           Total time spent taking care of this patient: 15 minutes  Signed: Thomasene Mohair, MD  12/06/2018, 1:14 AM

## 2018-12-06 NOTE — Discharge Instructions (Signed)
Call provider or return to birthplace with: ? ?1. Regular contractions ?2. Leaking of fluid from your vagina ?3. Vaginal bleeding: Bright red or heavy like a period ?4. Decreased Fetal movement  ?

## 2018-12-08 ENCOUNTER — Ambulatory Visit (INDEPENDENT_AMBULATORY_CARE_PROVIDER_SITE_OTHER): Payer: Self-pay | Admitting: Obstetrics and Gynecology

## 2018-12-08 VITALS — BP 120/70 | Wt 165.0 lb

## 2018-12-08 DIAGNOSIS — O99343 Other mental disorders complicating pregnancy, third trimester: Secondary | ICD-10-CM

## 2018-12-08 DIAGNOSIS — O0990 Supervision of high risk pregnancy, unspecified, unspecified trimester: Secondary | ICD-10-CM

## 2018-12-08 DIAGNOSIS — O24414 Gestational diabetes mellitus in pregnancy, insulin controlled: Secondary | ICD-10-CM

## 2018-12-08 DIAGNOSIS — Z3A36 36 weeks gestation of pregnancy: Secondary | ICD-10-CM

## 2018-12-08 DIAGNOSIS — F411 Generalized anxiety disorder: Secondary | ICD-10-CM

## 2018-12-08 LAB — FETAL NONSTRESS TEST

## 2018-12-08 LAB — POCT URINALYSIS DIPSTICK OB
Glucose, UA: NEGATIVE
PROTEIN: NEGATIVE

## 2018-12-08 NOTE — Progress Notes (Signed)
Routine Prenatal Care Visit  Subjective  Tiffany Wiley is a 28 y.o. G2P1001 at [redacted]w[redacted]d being seen today for ongoing prenatal care.  She is currently monitored for the following issues for this high-risk pregnancy and has Generalized anxiety disorder; Supervision of high risk pregnancy, antepartum; Nausea/vomiting in pregnancy; Heart palpitations; Tachycardia; UTI (urinary tract infection) during pregnancy; and Gestational diabetes on their problem list.  ----------------------------------------------------------------------------------- Patient reports no complaints.   Contractions: Not present. Vag. Bleeding: None.  Movement: Present. Denies leaking of fluid.  She brings a complete BG log with all normal fasting BG values and mostly normal 2 hour pp values.  ----------------------------------------------------------------------------------- The following portions of the patient's history were reviewed and updated as appropriate: allergies, current medications, past family history, past medical history, past social history, past surgical history and problem list. Problem list updated.  Objective  Blood pressure 120/70, weight 165 lb (74.8 kg), last menstrual period 03/05/2018. Pregravid weight 160 lb (72.6 kg) Total Weight Gain 5 lb (2.268 kg) Urinalysis: Urine Protein Negative  Urine Glucose Negative  Fetal Status: Fetal Heart Rate (bpm): 140 Fundal Height: 37 cm Movement: Present  Presentation: Vertex  General:  Alert, oriented and cooperative. Patient is in no acute distress.  Skin: Skin is warm and dry. No rash noted.   Cardiovascular: Normal heart rate noted  Respiratory: Normal respiratory effort, no problems with respiration noted  Abdomen: Soft, gravid, appropriate for gestational age. Pain/Pressure: Absent     Pelvic:  Cervical exam deferred        Extremities: Normal range of motion.  Edema: None  Mental Status: Normal mood and affect. Normal behavior. Normal judgment and thought  content.   NST Baseline FHR: 140 beats/min Variability: moderate Accelerations: present Decelerations: absent Tocometry: not done  Interpretation:  INDICATIONS: gestational diabetes mellitus RESULTS:  A NST procedure was performed with FHR monitoring and a normal baseline established, appropriate time of 20-40 minutes of evaluation, and accels >2 seen w 15x15 characteristics.  Results show a REACTIVE NST.    Bedside ultrasound for growth:   AFI normal.  Growth percentile: 68th. AC: 89th%ile.   MVP 5.7 cm.  Presentation: cephalic.  See measurements above.  The ultrasound was performed by me. The printer on the ultrasound was not working and I have no way of otherwise uploading the images to  PACS from this location.   Assessment   28 y.o. G2P1001 at [redacted]w[redacted]d by  01/01/2019, by Ultrasound presenting for routine prenatal visit  Plan   Pregnancy # 2 Problems (from 03/05/18 to present)    Problem Noted Resolved   Gestational diabetes 10/06/2018 by Conard Novak, MD No   UTI (urinary tract infection) during pregnancy 09/29/2018 by Conard Novak, MD No   Heart palpitations 08/13/2018 by Conard Novak, MD No   Tachycardia 08/13/2018 by Conard Novak, MD No   Nausea/vomiting in pregnancy 05/15/2018 by Conard Novak, MD No   Supervision of high risk pregnancy, antepartum 05/04/2018 by Conard Novak, MD No   Abnormal glucose tolerance in pregnancy 09/30/2018 by Conard Novak, MD 10/10/2018 by Conard Novak, MD       Preterm labor symptoms and general obstetric precautions including but not limited to vaginal bleeding, contractions, leaking of fluid and fetal movement were reviewed in detail with the patient. Please refer to After Visit Summary for other counseling recommendations.   - increase AM NPH to 18 units.  Continue with PM dose at 15 units.   - continue twice  weekly NST with weekly AFI.   - will consider IOL, as indicated. I believe her overall  control is much improved with insulin and she is tolerating the medication well. She denies any episodes of hypoglycemia.   Return in about 5 days (around 12/13/2018) for ROB/NST and 8 days ROB/NST, 2/24 with SDJ ROB/NST (overbook).  Thomasene Mohair, MD, [facility], [facility] 12/10/2018 1:15 PM

## 2018-12-10 ENCOUNTER — Encounter: Payer: Self-pay | Admitting: Obstetrics and Gynecology

## 2018-12-15 ENCOUNTER — Encounter: Payer: Self-pay | Admitting: Obstetrics & Gynecology

## 2018-12-15 ENCOUNTER — Ambulatory Visit (INDEPENDENT_AMBULATORY_CARE_PROVIDER_SITE_OTHER): Payer: Self-pay | Admitting: Obstetrics & Gynecology

## 2018-12-15 VITALS — BP 122/74 | Wt 166.0 lb

## 2018-12-15 DIAGNOSIS — O0990 Supervision of high risk pregnancy, unspecified, unspecified trimester: Secondary | ICD-10-CM

## 2018-12-15 DIAGNOSIS — O24414 Gestational diabetes mellitus in pregnancy, insulin controlled: Secondary | ICD-10-CM

## 2018-12-15 DIAGNOSIS — Z3A37 37 weeks gestation of pregnancy: Secondary | ICD-10-CM

## 2018-12-15 NOTE — Progress Notes (Signed)
  Subjective  Fetal Movement? yes Contractions? no Leaking Fluid? no Vaginal Bleeding? no BS Log all but 3 values normal    Insulin regime discussed    No side effects Objective  BP 122/74   Wt 166 lb (75.3 kg)   LMP 03/05/2018   BMI 29.41 kg/m  General: NAD Pumonary: no increased work of breathing Abdomen: gravid, non-tender Extremities: no edema Psychiatric: mood appropriate, affect full  Assessment  27 y.o. G2P1001 at [redacted]w[redacted]d by  01/01/2019, by Ultrasound presenting for routine prenatal visit  Plan    Gestational diabetes    Cont Insulin as dosed and scheduled    NST R today    NST AFI Monday    IOL options discussed   Supervision of high risk pregnancy, antepartum - Primary   [redacted] weeks gestation of pregnancy    PNV    [facility]    Labor precautions discussed        A NST procedure was performed with FHR monitoring and a normal baseline established, appropriate time of 20-40 minutes of evaluation, and accels >2 seen w 15x15 characteristics.  Results show a REACTIVE NST.    Annamarie Major, MD, [facility], [facility] 12/15/2018  11:08 AM

## 2018-12-20 ENCOUNTER — Ambulatory Visit (INDEPENDENT_AMBULATORY_CARE_PROVIDER_SITE_OTHER): Payer: Self-pay | Admitting: Obstetrics and Gynecology

## 2018-12-20 ENCOUNTER — Encounter: Payer: Self-pay | Admitting: Obstetrics and Gynecology

## 2018-12-20 VITALS — BP 126/74 | Wt 166.0 lb

## 2018-12-20 DIAGNOSIS — R000 Tachycardia, unspecified: Secondary | ICD-10-CM

## 2018-12-20 DIAGNOSIS — F411 Generalized anxiety disorder: Secondary | ICD-10-CM

## 2018-12-20 DIAGNOSIS — O2343 Unspecified infection of urinary tract in pregnancy, third trimester: Secondary | ICD-10-CM

## 2018-12-20 DIAGNOSIS — O99343 Other mental disorders complicating pregnancy, third trimester: Secondary | ICD-10-CM

## 2018-12-20 DIAGNOSIS — Z3A38 38 weeks gestation of pregnancy: Secondary | ICD-10-CM

## 2018-12-20 DIAGNOSIS — O0990 Supervision of high risk pregnancy, unspecified, unspecified trimester: Secondary | ICD-10-CM

## 2018-12-20 DIAGNOSIS — O9989 Other specified diseases and conditions complicating pregnancy, childbirth and the puerperium: Secondary | ICD-10-CM

## 2018-12-20 DIAGNOSIS — O24414 Gestational diabetes mellitus in pregnancy, insulin controlled: Secondary | ICD-10-CM

## 2018-12-20 LAB — FETAL NONSTRESS TEST

## 2018-12-20 NOTE — Progress Notes (Signed)
Routine Prenatal Care Visit  Subjective  Tiffany Wiley is a 28 y.o. G2P1001 at [redacted]w[redacted]d being seen today for ongoing prenatal care.  She is currently monitored for the following issues for this high-risk pregnancy and has Generalized anxiety disorder; Supervision of high risk pregnancy, antepartum; Nausea/vomiting in pregnancy; Heart palpitations; Tachycardia; UTI (urinary tract infection) during pregnancy; and Gestational diabetes on their problem list.  ----------------------------------------------------------------------------------- Patient reports lower abdominal pain and discomfort.    Contractions: Irregular. Vag. Bleeding: None.  Movement: Present. Denies leaking of fluid.  BG log shows mostly normal values. All fasting and 2h pp breakfast normal.  Most elevations are after dinner and some with lunch.   ----------------------------------------------------------------------------------- The following portions of the patient's history were reviewed and updated as appropriate: allergies, current medications, past family history, past medical history, past social history, past surgical history and problem list. Problem list updated.   Objective  Blood pressure 126/74, weight 166 lb (75.3 kg), last menstrual period 03/05/2018. Pregravid weight 160 lb (72.6 kg) Total Weight Gain 6 lb (2.722 kg) Urinalysis: Urine Protein    Urine Glucose    Fetal Status: Fetal Heart Rate (bpm): 145   Movement: Present  Presentation: Vertex  General:  Alert, oriented and cooperative. Patient is in no acute distress.  Skin: Skin is warm and dry. No rash noted.   Cardiovascular: Normal heart rate noted  Respiratory: Normal respiratory effort, no problems with respiration noted  Abdomen: Soft, gravid, appropriate for gestational age. Pain/Pressure: Present     Pelvic:  Cervical exam performed Dilation: 3 Effacement (%): 50 Station: -3  Extremities: Normal range of motion.  Edema: None  Mental Status: Normal  mood and affect. Normal behavior. Normal judgment and thought content.   BSUS: MVP 5 cm, fetus is cephalic  NST: Baseline FHR: 145 beats/min Variability: moderate Accelerations: present Decelerations: absent Tocometry: not done  Interpretation:  INDICATIONS: gestational diabetes mellitus requiring insulin RESULTS:  A NST procedure was performed with FHR monitoring and a normal baseline established, appropriate time of 20-40 minutes of evaluation, and accels >2 seen w 15x15 characteristics.  Results show a REACTIVE NST.    Assessment   28 y.o. G2P1001 at [redacted]w[redacted]d by  01/01/2019, by Ultrasound presenting for routine prenatal visit  Plan   Pregnancy # 2 Problems (from 03/05/18 to present)    Problem Noted Resolved   Gestational diabetes 10/06/2018 by Conard Novak, MD No   UTI (urinary tract infection) during pregnancy 09/29/2018 by Conard Novak, MD No   Heart palpitations 08/13/2018 by Conard Novak, MD No   Tachycardia 08/13/2018 by Conard Novak, MD No   Nausea/vomiting in pregnancy 05/15/2018 by Conard Novak, MD No   Supervision of high risk pregnancy, antepartum 05/04/2018 by Conard Novak, MD No   Abnormal glucose tolerance in pregnancy 09/30/2018 by Conard Novak, MD 10/10/2018 by Conard Novak, MD      Term labor symptoms and general obstetric precautions including but not limited to vaginal bleeding, contractions, leaking of fluid and fetal movement were reviewed in detail with the patient. Please refer to After Visit Summary for other counseling recommendations.   IOL scheduled for 2/27 @ 12 pm due to GDMA2 on insulin without good control.  - continue current dose and decrease carb intake at dinner. Let me know via text if values worsen so insulin dosing can be changed prior to induction.   Return if symptoms worsen or fail to improve.  Thomasene Mohair, MD, [facility],  [facility] 12/20/2018 5:50 PM

## 2018-12-20 NOTE — Progress Notes (Signed)
  [facility] INDUCTION ASSESSMENT SCHEDULING Tiffany Wiley 1990-10-28 Medical record #: [id number] Phone #:  Home Phone [phone]  Mobile [phone]    [facility] Proposed admission date/time:12/23/2018 at 12 PM Method of induction:Pitocin  Weight: Filed Weights02/24/20 1404Weight:166 lb (75.3 kg) BMI Body mass index is 29.41 kg/m. HIV Negative HSV Negative EDC Estimated Date of Delivery: 3/7/20based on:US at [redacted] wks  Gestational age on admission: [redacted]w[redacted]d Gravidity/parity:G2P1001  Cervix Score   0 1 2 3   Position Posterior Midposition Anterior   Consistency Firm Medium Soft   Effacement (%) 0-30 40-50 60-70 >80  Dilation (cm) Closed 1-2 3-4 >5  Baby's station -3 -2 -1 +1, +2   Bishop Score:6   Medical induction of labor    Medical Indications Adapted from ACOG Committee Opinion #[id number], "Medically Indicated Late Preterm and Early Term Deliveries," 2013.  PLACENTAL / UTERINE ISSUES FETAL ISSUES MATERNAL ISSUES  ? 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 Isoimmunization (37.0-38.6) ? Preeclampsia without severe features or gestational HTN (37.0)  ? Suspected accreta (34.0-35.6) ? Growth Restriction Mason Jim) ? Preeclampsia with severe features (34.0)  ? Prior classical CD, uterine window, rupture (36.0-37.6) ? Isolated (38.0-39.6) ? Chronic HTN (38.0-39.6)  ? Prior myomectomy (37.0-38.6) ? Concurrent findings (34.0-37.6) ? Cholestasis (37.0)  ? Umbilical vein varix (37.0) ? Growth Restriction (Twins) ? Diabetes  ? Placental abruption (chronic) ? Di-Di Isolated (36.0-37.6) ? Pregestational, controlled (39.0)  OBSTETRIC ISSUES ? Di-Di concurrent findings (32.0-34.6) ? Pregestational, uncontrolled (37.0-39.0)  ? Postdates ? (41 weeks) ? Mo-Di isolated (32.0-34.6) ? Pregestational, vascular compromise (37.0- 39.0)  ? PPROM (34.0) ? Multiple Gestation ? Gestational, diet controlled (40.0)  ? Hx of IUFD (39.0 weeks) ? Di-Di  (38.0-38.6) ? Gestational, med controlled (39.0)  ? Polyhydramnios, mild/moderate; SDV 8-16 or AFI 25-35 (39.0) ? Mo-Di (36.0-37.6) ? Gestational, uncontrolled (38.0-39.0)  ? Oligohydramnios (36.0-37.6); MVP <2 cm  For indications not listed above, delivery recommendations from maternal-fetal medicine consultant occurred on: Date: n/a   Provider Signature: Thomasene Mohair Scheduled ZC:HYIFOYD L, RN, Permission from Ilda Foil, RN Date:12/20/2018 5:55 PM   Call [phone] to finalize the induction date/time  [id number] (07/17)

## 2018-12-22 LAB — URINE CULTURE

## 2018-12-23 ENCOUNTER — Inpatient Hospital Stay: Payer: Medicaid Other | Admitting: Anesthesiology

## 2018-12-23 ENCOUNTER — Inpatient Hospital Stay
Admission: AD | Admit: 2018-12-23 | Discharge: 2018-12-25 | DRG: 806 | Disposition: A | Discharge status: Home / Self Care | Payer: Medicaid Other | Attending: Obstetrics and Gynecology | Admitting: Obstetrics and Gynecology

## 2018-12-23 ENCOUNTER — Other Ambulatory Visit: Payer: Self-pay

## 2018-12-23 DIAGNOSIS — O24419 Gestational diabetes mellitus in pregnancy, unspecified control: Secondary | ICD-10-CM | POA: Diagnosis present

## 2018-12-23 DIAGNOSIS — D62 Acute posthemorrhagic anemia: Secondary | ICD-10-CM | POA: Diagnosis not present

## 2018-12-23 DIAGNOSIS — Z3490 Encounter for supervision of normal pregnancy, unspecified, unspecified trimester: Secondary | ICD-10-CM | POA: Diagnosis present

## 2018-12-23 DIAGNOSIS — Z3A38 38 weeks gestation of pregnancy: Secondary | ICD-10-CM

## 2018-12-23 DIAGNOSIS — O24414 Gestational diabetes mellitus in pregnancy, insulin controlled: Secondary | ICD-10-CM

## 2018-12-23 DIAGNOSIS — O2343 Unspecified infection of urinary tract in pregnancy, third trimester: Secondary | ICD-10-CM

## 2018-12-23 DIAGNOSIS — O0990 Supervision of high risk pregnancy, unspecified, unspecified trimester: Secondary | ICD-10-CM

## 2018-12-23 DIAGNOSIS — O219 Vomiting of pregnancy, unspecified: Secondary | ICD-10-CM

## 2018-12-23 DIAGNOSIS — R000 Tachycardia, unspecified: Secondary | ICD-10-CM

## 2018-12-23 DIAGNOSIS — O9081 Anemia of the puerperium: Secondary | ICD-10-CM | POA: Diagnosis not present

## 2018-12-23 DIAGNOSIS — O24424 Gestational diabetes mellitus in childbirth, insulin controlled: Principal | ICD-10-CM | POA: Diagnosis present

## 2018-12-23 DIAGNOSIS — R002 Palpitations: Secondary | ICD-10-CM

## 2018-12-23 HISTORY — DX: Acute upper respiratory infection, unspecified: J06.9

## 2018-12-23 LAB — COMPREHENSIVE METABOLIC PANEL
ALT: 10 U/L (ref 0–44)
AST: 18 U/L (ref 15–41)
Albumin: 3.3 g/dL — ABNORMAL LOW (ref 3.5–5.0)
Alkaline Phosphatase: 97 U/L (ref 38–126)
Anion gap: 8 (ref 5–15)
BUN: 10 mg/dL (ref 6–20)
CO2: 19 mmol/L — ABNORMAL LOW (ref 22–32)
Calcium: 8.7 mg/dL — ABNORMAL LOW (ref 8.9–10.3)
Chloride: 107 mmol/L (ref 98–111)
Creatinine, Ser: 0.51 mg/dL (ref 0.44–1.00)
GFR calc non Af Amer: >60 mL/min (ref >60)
Glucose, Bld: 73 mg/dL (ref 70–99)
Potassium: 3.9 mmol/L (ref 3.5–5.1)
Sodium: 134 mmol/L — ABNORMAL LOW (ref 135–145)
Total Bilirubin: 0.6 mg/dL (ref 0.3–1.2)
Total Protein: 6.7 g/dL (ref 6.5–8.1)

## 2018-12-23 LAB — TYPE AND SCREEN
ABO/RH(D): AB POS
ANTIBODY SCREEN: NEGATIVE

## 2018-12-23 LAB — GLUCOSE, CAPILLARY
GLUCOSE-CAPILLARY: 87 mg/dL (ref 70–99)
Glucose-Capillary: 70 mg/dL (ref 70–99)
Glucose-Capillary: 78 mg/dL (ref 70–99)
Glucose-Capillary: 72 mg/dL (ref 70–99)
Glucose-Capillary: 72 mg/dL (ref 70–99)
Glucose-Capillary: 77 mg/dL (ref 70–99)
Glucose-Capillary: 69 mg/dL — ABNORMAL LOW (ref 70–99)
Glucose-Capillary: 73 mg/dL (ref 70–99)
Glucose-Capillary: 76 mg/dL (ref 70–99)
Glucose-Capillary: 76 mg/dL (ref 70–99)

## 2018-12-23 LAB — CBC
HCT: 33.9 % — ABNORMAL LOW (ref 36.0–46.0)
Hemoglobin: 10.9 g/dL — ABNORMAL LOW (ref 12.0–15.0)
MCH: 26.7 pg (ref 26.0–34.0)
MCHC: 32.2 g/dL (ref 30.0–36.0)
MCV: 83.1 fL (ref 80.0–100.0)
PLATELETS: 127 10*3/uL — AB (ref 150–400)
RBC: 4.08 MIL/uL (ref 3.87–5.11)
RDW: 13.8 % (ref 11.5–15.5)
WBC: 5.4 10*3/uL (ref 4.0–10.5)
nRBC: 0 % (ref 0.0–0.2)

## 2018-12-23 MED ORDER — OXYTOCIN 40 UNITS IN NORMAL SALINE INFUSION - SIMPLE MED
2.5000 [IU]/h | INTRAVENOUS | Status: DC
  Filled 2018-12-23: qty 1000

## 2018-12-23 MED ORDER — AMMONIA AROMATIC IN INHA: 0.3000 mL | Freq: Once | RESPIRATORY_TRACT | Status: DC | PRN

## 2018-12-23 MED ORDER — LIDOCAINE HCL (PF) 1 % IJ SOLN
30.0000 mL | INTRAMUSCULAR | Status: AC | PRN
  Administered 2018-12-23: 1.2 mL via SUBCUTANEOUS

## 2018-12-23 MED ORDER — EPHEDRINE 5 MG/ML INJ
10.0000 mg | INTRAVENOUS | Status: DC | PRN
  Filled 2018-12-23: qty 2

## 2018-12-23 MED ORDER — LIDOCAINE-EPINEPHRINE (PF) 1.5 %-1:200000 IJ SOLN
INTRAMUSCULAR | Status: DC | PRN
  Administered 2018-12-23: 3 mL via EPIDURAL

## 2018-12-23 MED ORDER — ONDANSETRON HCL 4 MG/2ML IJ SOLN: 4.0000 mg | Freq: Four times a day (QID) | INTRAMUSCULAR | Status: DC | PRN

## 2018-12-23 MED ORDER — OXYTOCIN BOLUS FROM INFUSION
500.0000 mL | Freq: Once | INTRAVENOUS | Status: AC
  Administered 2018-12-23: 500 mL via INTRAVENOUS

## 2018-12-23 MED ORDER — LACTATED RINGERS IV SOLN
500.0000 mL | Freq: Once | INTRAVENOUS | Status: AC
  Administered 2018-12-23: 500 mL via INTRAVENOUS

## 2018-12-23 MED ORDER — DIPHENHYDRAMINE HCL 50 MG/ML IJ SOLN: 12.5000 mg | INTRAMUSCULAR | Status: DC | PRN

## 2018-12-23 MED ORDER — LIDOCAINE HCL (PF) 1 % IJ SOLN
INTRAMUSCULAR | Status: AC
  Filled 2018-12-23: qty 30

## 2018-12-23 MED ORDER — MISOPROSTOL 200 MCG PO TABS
ORAL_TABLET | ORAL | Status: AC
  Filled 2018-12-23: qty 4

## 2018-12-23 MED ORDER — FENTANYL 2.5 MCG/ML W/ROPIVACAINE 0.15% IN NS 100 ML EPIDURAL (ARMC)
EPIDURAL | Status: DC | PRN
  Administered 2018-12-23: 12 mL/h via EPIDURAL

## 2018-12-23 MED ORDER — TERBUTALINE SULFATE 1 MG/ML IJ SOLN: 0.2500 mg | Freq: Once | INTRAMUSCULAR | Status: DC | PRN

## 2018-12-23 MED ORDER — OXYTOCIN 40 UNITS IN NORMAL SALINE INFUSION - SIMPLE MED
INTRAVENOUS | Status: AC
  Filled 2018-12-23: qty 1000

## 2018-12-23 MED ORDER — FENTANYL 2.5 MCG/ML W/ROPIVACAINE 0.15% IN NS 100 ML EPIDURAL (ARMC)
EPIDURAL | Status: AC
  Filled 2018-12-23: qty 100

## 2018-12-23 MED ORDER — PHENYLEPHRINE 40 MCG/ML (10ML) SYRINGE FOR IV PUSH (FOR BLOOD PRESSURE SUPPORT)
80.0000 ug | PREFILLED_SYRINGE | INTRAVENOUS | Status: DC | PRN
  Filled 2018-12-23: qty 10

## 2018-12-23 MED ORDER — OXYTOCIN 40 UNITS IN NORMAL SALINE INFUSION - SIMPLE MED
1.0000 m[IU]/min | INTRAVENOUS | Status: DC
  Administered 2018-12-23: 2 m[IU]/min via INTRAVENOUS

## 2018-12-23 MED ORDER — FENTANYL 2.5 MCG/ML W/ROPIVACAINE 0.15% IN NS 100 ML EPIDURAL (ARMC): 12.0000 mL/h | EPIDURAL | Status: DC

## 2018-12-23 MED ORDER — LACTATED RINGERS IV SOLN
INTRAVENOUS | Status: DC
  Administered 2018-12-23: 13:00:00 via INTRAVENOUS

## 2018-12-23 MED ORDER — MISOPROSTOL 200 MCG PO TABS: 800.0000 ug | ORAL_TABLET | Freq: Once | ORAL | Status: DC | PRN

## 2018-12-23 MED ORDER — LACTATED RINGERS IV SOLN: 500.0000 mL | INTRAVENOUS | Status: DC | PRN

## 2018-12-23 MED ORDER — AMMONIA AROMATIC IN INHA
RESPIRATORY_TRACT | Status: AC
  Filled 2018-12-23: qty 10

## 2018-12-23 MED ORDER — OXYTOCIN 10 UNIT/ML IJ SOLN
INTRAMUSCULAR | Status: AC
  Filled 2018-12-23: qty 2

## 2018-12-23 NOTE — Progress Notes (Signed)
Labor Check  Subj:  Complaints: comfortable with epidural   Obj:  BP 123/74 (BP Location: Left Arm)   Pulse 98   Temp 98.5 F (36.9 C) (Oral)   Resp 16   Ht 5\' 3"  (1.6 m)   Wt 74.8 kg   LMP 03/05/2018   BMI 29.23 kg/m  Dose (milli-units/min) Oxytocin: 7 milli-units/min(per MD order, after AROM)  Cervix: Dilation: 5 / Effacement (%): 80 / Station: -2   AROM: port-wine colored flue. No evidence of meconium.  Baseline FHR: 135 beats/min   Variability: moderate   Accelerations: present   Decelerations: present (occasional variables) Contractions: present frequency: 4-5 q 10 min Overall assessment: cat 1 overall  Last blood glucose: 73 mg/dL  A/P: 28 y.o. [id number] female at [redacted]w[redacted]d with IOL for GDMA2, requiring insulin - uncontrolled..  1.  Labor: AROM, reduce pitocin from 14 to 7.  2.  FWB: reassuring, Overall assessment: category 1  3.  GBS negative  4.  Pain: epidural 5.  Recheck: q 2 hours, prn 6. Continue Q 2 hour BG check.  Treat, as neede.   Thomasene Mohair, MD, [facility], [facility] 12/23/2018 8:18 PM

## 2018-12-23 NOTE — H&P (Signed)
OB History & Physical   History of Present Illness:  Chief Complaint: presents for induction of labor  HPI:  Tiffany Wiley is a 28 y.o. G2P1001 female at [redacted]w[redacted]d dated by 7 week ultrasound.  Her pregnancy has been complicated by gestational diabetes, requiring insulin, uncontrolled.    She denies contractions.   She denies leakage of fluid.   She denies vaginal bleeding.   She reports fetal movement.    Maternal Medical History:   Past Medical History:  Diagnosis Date  . Gestational diabetes   . Medical history non-contributory   . Recurrent upper respiratory infection (URI)   . Urinary tract infection     Past Surgical History:  Procedure Laterality Date  . NO PAST SURGERIES      Allergies  Allergen Reactions  . Other Hives, Shortness Of Breath and Swelling    Blueberries  . Shellfish Allergy Hives and Swelling    Prior to Admission medications   Medication Sig Start Date End Date Taking? Authorizing Provider  insulin NPH Human (HUMULIN N,NOVOLIN N) 100 UNIT/ML injection Inject 15 Units into the skin daily before breakfast.  12/06/18  Yes Conard Novak, MD  insulin NPH Human (HUMULIN N,NOVOLIN N) 100 UNIT/ML injection Inject 18 Units into the skin daily. With evening meal   Yes [provider]  Prenatal Vit-Fe Fumarate-FA (PRENATAL MULTIVITAMIN) TABS tablet Take 1 tablet by mouth daily at 12 noon.   Yes [provider]    OB History  Gravida Para Term Preterm AB Living  2 1 1  0 0 1  SAB TAB Ectopic Multiple Live Births  0 0 0 0 1    # Outcome Date GA Lbr Len/2nd Weight Sex Delivery Anes PTL Lv  2 Current           1 Term 09/08/15 [redacted]w[redacted]d  3060 g M Vag-Spont EPI  LIV    Prenatal care site: [facility]  Social History: She  reports that she has never smoked. She has never used smokeless tobacco. She reports that she does not drink alcohol or use drugs.  Family History: family history includes Breast cancer in an other family member; Healthy in  her mother.   Review of Systems:  Review of Systems  Constitutional: Negative.   HENT: Negative.   Eyes: Negative.   Respiratory: Negative.   Cardiovascular: Negative.   Gastrointestinal: Negative.   Genitourinary: Negative.   Musculoskeletal: Negative.   Skin: Negative.   Neurological: Negative.   Psychiatric/Behavioral: Negative.      Physical Exam:  Vital Signs: BP 123/74 (BP Location: Left Arm)   Pulse 98   Temp 98.5 F (36.9 C) (Oral)   Resp 16   Ht 5\' 3"  (1.6 m)   Wt 74.8 kg   LMP 03/05/2018   BMI 29.23 kg/m  Constitutional: Well nourished, well developed female in no acute distress.  HEENT: normal Skin: Warm and dry.  Cardiovascular: Regular rate and rhythm.   Extremity: no edema  Respiratory: Clear to auscultation bilateral. Normal respiratory effort Abdomen: FHT present and gravid/NT Back: no CVAT Neuro: DTRs 2+, Cranial nerves grossly intact Psych: Alert and Oriented x3. No memory deficits. Normal mood and affect.  MS: normal gait, normal bilateral lower extremity ROM/strength/stability.  Pelvic exam: 3 cm per CNM   Pertinent Results:  Prenatal Labs: Blood type/Rh AB positive  Antibody screen negative  Rubella Immune  Varicella Immune    RPR NR  HBsAg negative  HIV negative  GC negative  Chlamydia negative  Genetic screening Diploid XY, declined msAFP  1 hour GTT GDM  3 hour GTT   GBS negative on 11/29/2018   Baseline FHR: 135 beats/min   Variability: moderate   Accelerations: present   Decelerations: absent Contractions: present frequency: irregular Overall assessment: cat 1  Assessment:  Tiffany Wiley is a 78 y.o. G75P1001 female at [redacted]w[redacted]d with GDMA2, requiring insulin (uncontrolled), who presents for induction of labor.   Plan:  1. Admit to Labor & Delivery  2. CBC, T&S, Clrs, IVF 3. GBS negative.   4. Fetwal well-being: reassuring 5. Start pitocin for IOL   Thomasene Mohair, MD 12/23/2018 8:00 PM

## 2018-12-23 NOTE — Anesthesia Procedure Notes (Signed)
Epidural Patient location during procedure: OB Start time: 12/23/2018 7:28 PM End time: 12/23/2018 7:50 PM  Staffing Performed: anesthesiologist   Preanesthetic Checklist Completed: patient identified, site marked, surgical consent, pre-op evaluation, timeout performed, IV checked, risks and benefits discussed and monitors and equipment checked  Epidural Patient position: sitting Prep: Betadine Patient monitoring: heart rate, continuous pulse ox and blood pressure Approach: midline Location: L4-L5 Injection technique: LOR saline  Needle:  Needle type: Tuohy  Needle gauge: 18 G Needle length: 9 cm and 9 Catheter type: closed end flexible Catheter size: 20 Guage Test dose: negative and 1.5% lidocaine with Epi 1:200 K  Assessment Events: blood not aspirated, injection not painful, no injection resistance, negative IV test and no paresthesia  Additional Notes   Patient tolerated the insertion well without complications.Reason for block:procedure for pain

## 2018-12-23 NOTE — Progress Notes (Signed)
Labor Check  Subj:  Complaints: somewhat in pain with contractions   Obj:  BP 120/88   Pulse 99   Temp 97.8 F (36.6 C) (Oral)   Resp 16   Ht 5\' 3"  (1.6 m)   Wt 74.8 kg   LMP 03/05/2018   BMI 29.23 kg/m  Dose (milli-units/min) Oxytocin: 14 milli-units/min  Cervix: Dilation: 3.5 / Effacement (%): 70 / Station: -2  Baseline FHR: 145 beats/min   Variability: moderate   Accelerations: present   Decelerations: absent Contractions: present frequency: 4-5 q 10 min Overall assessment: cat 1  A/P: 28 y.o. G2P1001 female at [redacted]w[redacted]d with IOL for gestational diabetes requiring insulin, uncontrolled.  1.  Labor: continue pitocin per protocol. Plan for AROM in about 1 hour.  2.  FWB: reassuring, Overall assessment: category 1  3.  GBS negative  4.  Pain: prn, epidural when ready 5.  Recheck: 1-1.5 hours   Thomasene Mohair, MD, [facility], [facility] 12/23/2018 6:11 PM

## 2018-12-23 NOTE — Anesthesia Preprocedure Evaluation (Signed)
Anesthesia Evaluation  Patient identified by MRN, date of birth, ID band Patient awake    Reviewed: Allergy & Precautions, NPO status , Patient's Chart, lab work & pertinent test results  History of Anesthesia Complications Negative for: history of anesthetic complications  Airway Mallampati: II       Dental   Pulmonary neg sleep apnea, neg COPD,           Cardiovascular (-) hypertension(-) Past MI (-) dysrhythmias (-) Valvular Problems/Murmurs     Neuro/Psych neg Seizures Anxiety    GI/Hepatic Neg liver ROS, neg GERD  ,  Endo/Other  diabetes, Gestational  Renal/GU negative Renal ROS     Musculoskeletal   Abdominal   Peds  Hematology   Anesthesia Other Findings   Reproductive/Obstetrics (+) Pregnancy                             Anesthesia Physical Anesthesia Plan  ASA: II  Anesthesia Plan: Epidural   Post-op Pain Management:    Induction:   PONV Risk Score and Plan:   Airway Management Planned:   Additional Equipment:   Intra-op Plan:   Post-operative Plan:   Informed Consent: I have reviewed the patients History and Physical, chart, labs and discussed the procedure including the risks, benefits and alternatives for the proposed anesthesia with the patient or authorized representative who has indicated his/her understanding and acceptance.       Plan Discussed with:   Anesthesia Plan Comments:         Anesthesia Quick Evaluation

## 2018-12-23 NOTE — Discharge Summary (Signed)
OB Discharge Summary     Patient Name: Tiffany Wiley DOB: 1991-03-21 MRN: [id number]  Date of admission: 12/23/2018 Delivering MD: Thomasene Mohair, MD  Date of Delivery: 12/23/2018  Date of discharge: 12/25/2018  Admitting diagnosis: Induction of labor for gestational diabetes requiring insulin Intrauterine pregnancy: [redacted]w[redacted]d     Secondary diagnosis: Gestational Diabetes medication controlled (A2)     Discharge diagnosis: Term Pregnancy Delivered and GDM A2                                                                                                Post partum procedures:none  Augmentation: AROM and Pitocin  Complications: None  Hospital course:  Induction of Labor With Vaginal Delivery   28 y.o. yo G2P1001 at [redacted]w[redacted]d was admitted to the hospital 12/23/2018 for induction of labor.  Indication for induction: A2 DM.  Patient had an uncomplicated labor course as follows: Membrane Rupture Time/Date: 8:13 PM ,12/23/2018   Intrapartum Procedures: Episiotomy: None [1]                                         Lacerations:  None [1]  Patient had delivery of a Viable infant.  Information for the patient's newborn:  Rahaf, Balbo [[id number]]  Delivery Method: Vag-Spont   12/23/2018  Details of delivery can be found in separate delivery note.  Patient had a routine postpartum course. Patient is discharged home 12/25/18.  Physical exam  Vitals:   12/24/18 1928 12/25/18 0146 12/25/18 0457 12/25/18 0740  BP: 128/77 108/72 116/87 125/87  Pulse: 93 70 84 81  Resp: 20 18 18 18   Temp: 98.4 F (36.9 C) 98.2 F (36.8 C) 97.9 F (36.6 C) 98.3 F (36.8 C)  TempSrc:  Oral Oral Oral  SpO2: 100% 100% 100% 100%  Weight:      Height:       General: alert, cooperative and no distress Lochia: appropriate Uterine Fundus: firm Incision: N/A DVT Evaluation: No evidence of DVT seen on physical exam. Negative Homan's sign.  Labs: Lab Results  Component Value Date   WBC 10.2 12/24/2018   HGB 8.6  (L) 12/24/2018   HCT 27.1 (L) 12/24/2018   MCV 84.2 12/24/2018   PLT 137 (L) 12/24/2018    Discharge instruction: per After Visit Summary.  Medications:  Allergies as of 12/25/2018      Reactions   Other Hives, Shortness Of Breath, Swelling   Blueberries   Shellfish Allergy Hives, Swelling      Medication List    STOP taking these medications   insulin NPH Human 100 UNIT/ML injection Commonly known as:  HUMULIN N,NOVOLIN N     TAKE these medications   prenatal multivitamin Tabs tablet Take 1 tablet by mouth daily at 12 noon.     Also- Take iron daily for one month  Diet: routine diet  Activity: Advance as tolerated. Pelvic rest for 6 weeks.   Outpatient follow up: Follow-up Information    Conard Novak, MD. Schedule an appointment as  soon as possible for a visit in 2 week(s).   Specialty:  Obstetrics and Gynecology Why:  postpartum mood check Contact information: [address] [place] [address] [phone]             Postpartum contraception: Vasectomy Rhogam Given postpartum: no Rubella vaccine given postpartum: no Varicella vaccine given postpartum: no TDaP given antepartum or postpartum: 11/09/2018  Newborn Data: Live born female  Birth Weight:   APGAR: 8, 9  Newborn Delivery   Birth date/time:  12/23/2018 22:08:00 Delivery type:  Vaginal, Spontaneous    Baby Feeding: Breast  Disposition:home with mother  SIGNED: Annamarie Major, MD, [facility], [facility] 12/25/2018  9:36 AM

## 2018-12-24 LAB — CBC
HCT: 27.1 % — ABNORMAL LOW (ref 36.0–46.0)
Hemoglobin: 8.6 g/dL — ABNORMAL LOW (ref 12.0–15.0)
MCH: 26.7 pg (ref 26.0–34.0)
MCHC: 31.7 g/dL (ref 30.0–36.0)
MCV: 84.2 fL (ref 80.0–100.0)
Platelets: 137 10*3/uL — ABNORMAL LOW (ref 150–400)
RBC: 3.22 MIL/uL — ABNORMAL LOW (ref 3.87–5.11)
RDW: 13.7 % (ref 11.5–15.5)
WBC: 10.2 10*3/uL (ref 4.0–10.5)
nRBC: 0 % (ref 0.0–0.2)

## 2018-12-24 LAB — RPR: RPR: NONREACTIVE

## 2018-12-24 LAB — GLUCOSE, CAPILLARY
Glucose-Capillary: 69 mg/dL — ABNORMAL LOW (ref 70–99)
Glucose-Capillary: 99 mg/dL (ref 70–99)
Glucose-Capillary: 113 mg/dL — ABNORMAL HIGH (ref 70–99)
Glucose-Capillary: 135 mg/dL — ABNORMAL HIGH (ref 70–99)

## 2018-12-24 MED ORDER — FERROUS SULFATE 325 (65 FE) MG PO TABS
325.0000 mg | ORAL_TABLET | Freq: Two times a day (BID) | ORAL | Status: DC
  Administered 2018-12-24 – 2018-12-25 (×3): 325 mg via ORAL
  Filled 2018-12-24 (×3): qty 1

## 2018-12-24 MED ORDER — ONDANSETRON HCL 4 MG PO TABS: 4.0000 mg | ORAL_TABLET | ORAL | Status: DC | PRN

## 2018-12-24 MED ORDER — HYDROCODONE-ACETAMINOPHEN 5-325 MG PO TABS: 1.0000 | ORAL_TABLET | Freq: Three times a day (TID) | ORAL | Status: DC | PRN

## 2018-12-24 MED ORDER — SENNOSIDES-DOCUSATE SODIUM 8.6-50 MG PO TABS
2.0000 | ORAL_TABLET | ORAL | Status: DC
  Administered 2018-12-24 – 2018-12-25 (×2): 2 via ORAL
  Filled 2018-12-24 (×2): qty 2

## 2018-12-24 MED ORDER — IBUPROFEN 600 MG PO TABS
600.0000 mg | ORAL_TABLET | Freq: Four times a day (QID) | ORAL | Status: DC
  Administered 2018-12-24 – 2018-12-25 (×6): 600 mg via ORAL
  Filled 2018-12-24 (×6): qty 1

## 2018-12-24 MED ORDER — WITCH HAZEL-GLYCERIN EX PADS: 1.0000 "application " | MEDICATED_PAD | CUTANEOUS | Status: DC | PRN

## 2018-12-24 MED ORDER — ACETAMINOPHEN 325 MG PO TABS: 650.0000 mg | ORAL_TABLET | ORAL | Status: DC | PRN

## 2018-12-24 MED ORDER — DIBUCAINE 1 % RE OINT: 1.0000 "application " | TOPICAL_OINTMENT | RECTAL | Status: DC | PRN

## 2018-12-24 MED ORDER — SIMETHICONE 80 MG PO CHEW: 80.0000 mg | CHEWABLE_TABLET | ORAL | Status: DC | PRN

## 2018-12-24 MED ORDER — BENZOCAINE-MENTHOL 20-0.5 % EX AERO: 1.0000 "application " | INHALATION_SPRAY | CUTANEOUS | Status: DC | PRN

## 2018-12-24 MED ORDER — ONDANSETRON HCL 4 MG/2ML IJ SOLN: 4.0000 mg | INTRAMUSCULAR | Status: DC | PRN

## 2018-12-24 MED ORDER — DIPHENHYDRAMINE HCL 25 MG PO CAPS: 25.0000 mg | ORAL_CAPSULE | Freq: Four times a day (QID) | ORAL | Status: DC | PRN

## 2018-12-24 MED ORDER — COCONUT OIL OIL: 1.0000 "application " | TOPICAL_OIL | Status: DC | PRN

## 2018-12-24 MED ORDER — PRENATAL MULTIVITAMIN CH
1.0000 | ORAL_TABLET | Freq: Every day | ORAL | Status: DC
  Administered 2018-12-24: 1 via ORAL
  Filled 2018-12-24: qty 1

## 2018-12-24 MED ORDER — CALCIUM CARBONATE ANTACID 500 MG PO CHEW
400.0000 mg | CHEWABLE_TABLET | Freq: Three times a day (TID) | ORAL | Status: DC | PRN
  Administered 2018-12-24: 400 mg via ORAL
  Filled 2018-12-24: qty 2

## 2018-12-24 NOTE — Anesthesia Postprocedure Evaluation (Signed)
Anesthesia Post Note  Patient: Tiffany Wiley  Procedure(s) Performed: AN AD HOC LABOR EPIDURAL  Patient location during evaluation: [facility] Anesthesia Type: Epidural Level of consciousness: awake, awake and alert and oriented Pain management: pain level controlled Vital Signs Assessment: post-procedure vital signs reviewed and stable Respiratory status: spontaneous breathing, nonlabored ventilation and respiratory function stable Cardiovascular status: blood pressure returned to baseline and stable Postop Assessment: no headache Anesthetic complications: no     Last Vitals:  Vitals:   12/24/18 0240 12/24/18 0414  BP: 118/74 107/69  Pulse: 100 85  Resp: 16 18  Temp: 36.9 C 37 C  SpO2: 98% 97%    Last Pain:  Vitals:   12/24/18 0631  TempSrc:   PainSc: 1                  Vernie Murders

## 2018-12-24 NOTE — Progress Notes (Signed)
PPD#1 SVD Subjective:  Well appearing, holding baby. Pain control is good. Voiding without difficulty. Tolerating a regular diet. Ambulating well.  Objective:   Blood pressure 119/85, pulse 80, temperature 97.8 F (36.6 C), temperature source Oral, resp. rate 16, height 5\' 3"  (1.6 m), weight 74.8 kg, last menstrual period 03/05/2018, SpO2 100 %, currently breastfeeding.  General: NAD Pulmonary: no increased work of breathing Abdomen: non-distended, non-tender Uterus:  fundus firm; lochia appropriate Extremities: no edema, no erythema, no tenderness, no signs of DVT  Results for orders placed or performed during the hospital encounter of 12/23/18 (from the past 72 hour(s))  Glucose, capillary     Status: None   Collection Time: 12/23/18  1:00 PM  Result Value Ref Range   Glucose-Capillary 70 70 - 99 mg/dL  Type and screen [facility]     Status: None   Collection Time: 12/23/18  1:12 PM  Result Value Ref Range   ABO/RH(D) AB POS    Antibody Screen NEG    Sample Expiration      12/26/2018 Performed at [facility], [address] [place], [place] [address]   CBC     Status: Abnormal   Collection Time: 12/23/18  1:12 PM  Result Value Ref Range   WBC 5.4 4.0 - 10.5 K/uL   RBC 4.08 3.87 - 5.11 MIL/uL   Hemoglobin 10.9 (L) 12.0 - 15.0 g/dL   HCT 15.9 (L) 45.8 - 59.2 %   MCV 83.1 80.0 - 100.0 fL   MCH 26.7 26.0 - 34.0 pg   MCHC 32.2 30.0 - 36.0 g/dL   RDW 92.4 46.2 - 86.3 %   Platelets 127 (L) 150 - 400 K/uL   nRBC 0.0 0.0 - 0.2 %    Comment: Performed at [facility], [address] [place], [place] [address]  Comprehensive metabolic panel     Status: Abnormal   Collection Time: 12/23/18  1:12 PM  Result Value Ref Range   Sodium 134 (L) 135 - 145 mmol/L   Potassium 3.9 3.5 - 5.1 mmol/L   Chloride 107 98 - 111 mmol/L   CO2 19 (L) 22 - 32 mmol/L   Glucose, Bld 73 70 - 99 mg/dL   BUN 10 6 - 20 mg/dL   Creatinine, Ser 1.65 0.44 -  1.00 mg/dL   Calcium 8.7 (L) 8.9 - 10.3 mg/dL   Total Protein 6.7 6.5 - 8.1 g/dL   Albumin 3.3 (L) 3.5 - 5.0 g/dL   AST 18 15 - 41 U/L   ALT 10 0 - 44 U/L   Alkaline Phosphatase 97 38 - 126 U/L   Total Bilirubin 0.6 0.3 - 1.2 mg/dL   GFR calc non Af Amer >60 >60 mL/min   GFR calc Af Amer >60 >60 mL/min   Anion gap 8 5 - 15    Comment: Performed at [facility], [address] [place], [place] [address]  RPR     Status: None   Collection Time: 12/23/18  1:12 PM  Result Value Ref Range   RPR Ser Ql Non Reactive Non Reactive    Comment: (NOTE) Performed At: [facility] [address] [place], [place] [id number] Jolene Schimke MD TY:[id number]   Glucose, capillary     Status: None   Collection Time: 12/23/18  2:05 PM  Result Value Ref Range   Glucose-Capillary 87 70 - 99 mg/dL  Glucose, capillary     Status: None   Collection Time: 12/23/18  3:05 PM  Result Value Ref Range   Glucose-Capillary 78 70 - 99 mg/dL  Glucose, capillary     Status: None   Collection Time: 12/23/18  4:05 PM  Result Value Ref Range   Glucose-Capillary 72 70 - 99 mg/dL  Glucose, capillary     Status: None   Collection Time: 12/23/18  5:06 PM  Result Value Ref Range   Glucose-Capillary 72 70 - 99 mg/dL  Glucose, capillary     Status: None   Collection Time: 12/23/18  6:16 PM  Result Value Ref Range   Glucose-Capillary 77 70 - 99 mg/dL  Glucose, capillary     Status: Abnormal   Collection Time: 12/23/18  7:01 PM  Result Value Ref Range   Glucose-Capillary 69 (L) 70 - 99 mg/dL  Glucose, capillary     Status: None   Collection Time: 12/23/18  8:07 PM  Result Value Ref Range   Glucose-Capillary 73 70 - 99 mg/dL  Glucose, capillary     Status: None   Collection Time: 12/23/18  9:09 PM  Result Value Ref Range   Glucose-Capillary 76 70 - 99 mg/dL  Glucose, capillary     Status: None   Collection Time: 12/23/18 10:35 PM  Result Value Ref Range   Glucose-Capillary 76 70 - 99 mg/dL   CBC     Status: Abnormal   Collection Time: 12/24/18  5:55 AM  Result Value Ref Range   WBC 10.2 4.0 - 10.5 K/uL   RBC 3.22 (L) 3.87 - 5.11 MIL/uL   Hemoglobin 8.6 (L) 12.0 - 15.0 g/dL   HCT 77.8 (L) 24.2 - 35.3 %   MCV 84.2 80.0 - 100.0 fL   MCH 26.7 26.0 - 34.0 pg   MCHC 31.7 30.0 - 36.0 g/dL   RDW 61.4 43.1 - 54.0 %   Platelets 137 (L) 150 - 400 K/uL   nRBC 0.0 0.0 - 0.2 %    Comment: Performed at [facility], [address] [place], [place] [address]  Glucose, capillary     Status: Abnormal   Collection Time: 12/24/18  8:43 AM  Result Value Ref Range   Glucose-Capillary 69 (L) 70 - 99 mg/dL    Assessment:   28 y.o. G2P2002 postpartum day #1 recovering well.  Plan:   1) Acute blood loss anemia - hemodynamically stable and asymptomatic - PO ferrous sulfate and vitamins with iron  2) Blood Type --/--/AB POS (02/27 1312)    3) Rubella 2.68 (08/20 1407) / Varicella Immune / TDAP status: received 11/09/2018  4) Breastfeeding  5) Contraception: not discussed  6) Disposition: continue postpartum care.  Marcelyn Bruins, CNM 12/24/2018

## 2018-12-24 NOTE — Progress Notes (Signed)
CBG 135 mg/dl. Patient states she "ate pizza 2 hours ago" and "I ate a snickers bar 30 minutes age" at 78.

## 2018-12-24 NOTE — Lactation Note (Signed)
This note was copied from a baby's chart. Lactation Consultation Note  Patient Name: Boy Damaris Hattery [id number] Date: 12/24/2018 Reason for consult: Initial assessment;Early term 56-38.6wks Mom states she did not offer baby breast this feeding, desired to formula feed and try pumping breasts to see how much milk she could express with her Spectra pump. This pump is a single pump.  She states she had planned on pumping and giving EBM at home. Was going to breastfeed only in hospital and start pumping at home, but baby needed formula for low blood sugar yesterday and got used to flow from bottle and now still wants more feeding after breastfeeding.  She states that she plans on breastfeeding baby first at next feeding and then formula feeding after if needed.  She stated that baby is latching well to the breast but will ask for assistance if needed.  She states her first child had trouble latching but this child is doing well.   Maternal Data Formula Feeding for Exclusion: No Does the patient have breastfeeding experience prior to this delivery?: Yes  Feeding Feeding Type: Bottle Fed - Formula Nipple Type: Slow - flow  LATCH Score Latch: Too sleepy or reluctant, no latch achieved, no sucking elicited.(mom did not try to latch baby)                 Interventions    Lactation Tools Discussed/Used Tools: Pump Breast pump type: Other (comment)(pt desires to pump breasts with her Spectra)   Consult Status Consult Status: Follow-up Date: 12/25/18 Follow-up type: In-patient    Dyann Kief 12/24/2018, 4:41 PM

## 2018-12-25 LAB — GLUCOSE, CAPILLARY: Glucose-Capillary: 78 mg/dL (ref 70–99)

## 2018-12-25 NOTE — Progress Notes (Signed)
Admit Date: 12/23/2018 Today's Date: 12/25/2018  Post Partum Day 2  Subjective:  no complaints, up ad lib, voiding, tolerating PO and + flatus  Objective: Temp:  [97.9 F (36.6 C)-98.4 F (36.9 C)] 98.3 F (36.8 C) (02/29 0740) Pulse Rate:  [70-97] 81 (02/29 0740) Resp:  [16-20] 18 (02/29 0740) BP: (108-132)/(72-87) 125/87 (02/29 0740) SpO2:  [100 %] 100 % (02/29 0740)  Physical Exam:  General: alert, cooperative and no distress Lochia: appropriate Uterine Fundus: firm Incision: none DVT Evaluation: No evidence of DVT seen on physical exam. Negative Homan's sign.  Recent Labs    12/23/18 1312 12/24/18 0555  HGB 10.9* 8.6*  HCT 33.9* 27.1*    Assessment/Plan: Discharge home, Breastfeeding, Contraception (plans vasec) and Infant doing well   LOS: 2 days   Tiffany Wiley [facility] 12/25/2018, 9:37 AM

## 2018-12-25 NOTE — Plan of Care (Signed)
Patient's vital signs stable; fundus firm; small amount rubra lochia; voiding; good appetite; good po fluids; pain controlled with po motrin; breast and bottle feeding with good technique observed; good maternal-infant bonding observed; husband at bedside and attentive; plans for discharge today.

## 2018-12-25 NOTE — Progress Notes (Signed)
Discharge instructions given. Patient verbalizes understanding of teaching. Patient discharged home via wheelchair at 1110. 

## 2019-01-05 ENCOUNTER — Other Ambulatory Visit: Payer: Self-pay

## 2019-01-05 ENCOUNTER — Encounter: Payer: Self-pay | Admitting: Obstetrics and Gynecology

## 2019-01-05 ENCOUNTER — Ambulatory Visit (INDEPENDENT_AMBULATORY_CARE_PROVIDER_SITE_OTHER): Payer: Self-pay | Admitting: Obstetrics and Gynecology

## 2019-01-05 VITALS — BP 118/74 | Ht 63.0 in | Wt 147.0 lb

## 2019-01-05 DIAGNOSIS — F411 Generalized anxiety disorder: Secondary | ICD-10-CM

## 2019-01-05 DIAGNOSIS — O0990 Supervision of high risk pregnancy, unspecified, unspecified trimester: Secondary | ICD-10-CM

## 2019-01-05 MED ORDER — CITALOPRAM HYDROBROMIDE 10 MG PO TABS: ORAL_TABLET | ORAL | 0 refills | Status: DC

## 2019-01-05 NOTE — Progress Notes (Signed)
Obstetrics & Gynecology Office Visit   Chief Complaint  Patient presents with  . Follow-up  postpartum depression  History of Present Illness: 28 y.o. [id number] who is 2 weeks postpartum from an uncomplicated SVD.  She presents stating that she feels more anxious, as she has a history of anxiety.  She had previously been on Celexa 20 mg daily.  However, she stopped taking this medication during her pregnancy.  She denies any depressive symptoms, especially suicidal and homicidal ideation.  She does want to restart her medication.  She has no other issues since her delivery.  She denies heavy bleeding, fevers, breast issues.  She is currently bottlefeeding.  Past Medical History:  Diagnosis Date  . Gestational diabetes   . Medical history non-contributory   . Recurrent upper respiratory infection (URI)   . Urinary tract infection     Past Surgical History:  Procedure Laterality Date  . NO PAST SURGERIES      Gynecologic History: No LMP recorded.  Obstetric History: [id number]  Family History  Problem Relation Age of Onset  . Breast cancer Other   . Healthy Mother     Social History   Socioeconomic History  . Marital status: Married    Spouse name: JR  . Number of children: Not on file  . Years of education: Not on file  . Highest education level: Not on file  Occupational History  . Not on file  Social Needs  . Financial resource strain: Not hard at all  . Food insecurity:    Worry: Never true    Inability: Never true  . Transportation needs:    Medical: No    Non-medical: No  Tobacco Use  . Smoking status: Never Smoker  . Smokeless tobacco: Never Used  Substance and Sexual Activity  . Alcohol use: No  . Drug use: No  . Sexual activity: Yes    Birth control/protection: Condom  Lifestyle  . Physical activity:    Days per week: 3 days    Minutes per session: 30 min  . Stress: Only a little  Relationships  . Social connections:    Talks on phone: More than  three times a week    Gets together: More than three times a week    Attends religious service: Never    Active member of club or organization: No    Attends meetings of clubs or organizations: Never    Relationship status: Married  . Intimate partner violence:    Fear of current or ex partner: Not on file    Emotionally abused: Not on file    Physically abused: Not on file    Forced sexual activity: Not on file  Other Topics Concern  . Not on file  Social History Narrative  . Not on file    Allergies  Allergen Reactions  . Other Hives, Shortness Of Breath and Swelling    Blueberries  . Shellfish Allergy Hives and Swelling    Prior to Admission medications   Medication Sig Start Date End Date Taking? Authorizing Provider  Prenatal Vit-Fe Fumarate-FA (PRENATAL MULTIVITAMIN) TABS tablet Take 1 tablet by mouth daily at 12 noon.    [provider]    Review of Systems  Constitutional: Negative.   HENT: Negative.   Eyes: Negative.   Respiratory: Negative.   Cardiovascular: Negative.   Gastrointestinal: Negative.   Genitourinary: Negative.   Musculoskeletal: Negative.   Skin: Negative.   Neurological: Negative.   Psychiatric/Behavioral: Negative for depression, hallucinations,  memory loss, substance abuse and suicidal ideas. The patient is nervous/anxious. The patient does not have insomnia.      Physical Exam BP 118/74   Ht 5\' 3"  (1.6 m)   Wt 147 lb (66.7 kg)   BMI 26.04 kg/m  No LMP recorded. Physical Exam Constitutional:      General: She is not in acute distress.    Appearance: Normal appearance.  HENT:     Head: Normocephalic and atraumatic.  Eyes:     General: No scleral icterus.    Conjunctiva/sclera: Conjunctivae normal.  Neurological:     General: No focal deficit present.     Mental Status: She is alert and oriented to person, place, and time.     Cranial Nerves: No cranial nerve deficit.  Psychiatric:        Mood and Affect: Mood normal.         Behavior: Behavior normal.        Judgment: Judgment normal.    Assessment: 28 y.o. [id number] female here for  1. Generalized anxiety disorder   2. Supervision of high risk pregnancy, antepartum      Plan: Problem List Items Addressed This Visit      Other   Generalized anxiety disorder - Primary   Relevant Medications   citalopram (CELEXA) 10 MG tablet   Supervision of high risk pregnancy, antepartum   Relevant Medications   citalopram (CELEXA) 10 MG tablet     Overall she is doing well.  However, she does have some component of anxiety, for which she would like treatment.  We will start her on citalopram 10 mg daily for 1 week, then increase to 20 mg daily.  We will reassess at her 6-week appointment.  She was given precautions regarding worsening symptoms of anxiety or depression.  She voiced understanding and agreement with the plan.  Thomasene Mohair, MD 01/05/2019 11:45 AM

## 2019-02-02 ENCOUNTER — Ambulatory Visit (INDEPENDENT_AMBULATORY_CARE_PROVIDER_SITE_OTHER): Payer: Self-pay | Admitting: Obstetrics and Gynecology

## 2019-02-02 ENCOUNTER — Encounter: Payer: Self-pay | Admitting: Obstetrics and Gynecology

## 2019-02-02 ENCOUNTER — Other Ambulatory Visit: Payer: Self-pay

## 2019-02-02 DIAGNOSIS — Z1389 Encounter for screening for other disorder: Secondary | ICD-10-CM

## 2019-02-02 DIAGNOSIS — Z30011 Encounter for initial prescription of contraceptive pills: Secondary | ICD-10-CM

## 2019-02-02 MED ORDER — NORGESTIMATE-ETH ESTRADIOL 0.25-35 MG-MCG PO TABS: 1.0000 | ORAL_TABLET | Freq: Every day | ORAL | 4 refills | Status: DC

## 2019-02-02 NOTE — Progress Notes (Signed)
Postpartum Visit   Chief Complaint  Patient presents with  . Postpartum Care    History of Present Illness: Patient is a 28 y.o. [id number] presents for postpartum visit.  Date of delivery: 12/23/2018 Type of delivery: Vaginal delivery  Episiotomy No.  Laceration: No Pregnancy or labor problems:  Gestational diabetes Any problems since the delivery:  no  Newborn Details:  SINGLETON :  1. Baby's name: Adela Lank. Birth weight: 8.8lb Maternal Details:  Breast Feeding:  no Post partum depression/anxiety noted:  Yes - on celexa [place] Post-Partum Depression Score:  7  Date of last PAP: 09/30/2017 NORMAL  Past Medical History:  Diagnosis Date  . Gestational diabetes   . Medical history non-contributory   . Recurrent upper respiratory infection (URI)   . Urinary tract infection     Past Surgical History:  Procedure Laterality Date  . NO PAST SURGERIES      Prior to Admission medications   Medication Sig Start Date End Date Taking? Authorizing Provider  citalopram (CELEXA) 10 MG tablet Take 1 tablet (10 mg total) by mouth daily for 7 days, THEN 2 tablets (20 mg total) daily for 23 days. 01/05/19 02/04/19  Conard Novak, MD  Prenatal Vit-Fe Fumarate-FA (PRENATAL MULTIVITAMIN) TABS tablet Take 1 tablet by mouth daily at 12 noon.    [provider]    Allergies  Allergen Reactions  . Other Hives, Shortness Of Breath and Swelling    Blueberries  . Shellfish Allergy Hives and Swelling     Social History   Socioeconomic History  . Marital status: Married    Spouse name: JR  . Number of children: Not on file  . Years of education: Not on file  . Highest education level: Not on file  Occupational History  . Not on file  Social Needs  . Financial resource strain: Not hard at all  . Food insecurity:    Worry: Never true    Inability: Never true  . Transportation needs:    Medical: No    Non-medical: No  Tobacco Use  . Smoking status: Never Smoker   . Smokeless tobacco: Never Used  Substance and Sexual Activity  . Alcohol use: No  . Drug use: No  . Sexual activity: Yes    Birth control/protection: Condom  Lifestyle  . Physical activity:    Days per week: 3 days    Minutes per session: 30 min  . Stress: Only a little  Relationships  . Social connections:    Talks on phone: More than three times a week    Gets together: More than three times a week    Attends religious service: Never    Active member of club or organization: No    Attends meetings of clubs or organizations: Never    Relationship status: Married  . Intimate partner violence:    Fear of current or ex partner: Not on file    Emotionally abused: Not on file    Physically abused: Not on file    Forced sexual activity: Not on file  Other Topics Concern  . Not on file  Social History Narrative  . Not on file    Family History  Problem Relation Age of Onset  . Breast cancer Other   . Healthy Mother     Review of Systems  Constitutional: Negative.   HENT: Negative.   Eyes: Negative.   Respiratory: Negative.   Cardiovascular: Negative.   Gastrointestinal: Negative.   Genitourinary: Negative.  Musculoskeletal: Negative.   Skin: Negative.   Neurological: Negative.   Psychiatric/Behavioral: Negative.      Physical Exam BP 122/74   Ht 5\' 4"  (1.626 m)   Wt 150 lb (68 kg)   LMP 02/01/2019   BMI 25.75 kg/m   Physical Exam Constitutional:      General: She is not in acute distress.    Appearance: Normal appearance. She is well-developed.  Genitourinary:     Pelvic exam was performed with patient in the lithotomy position.     Vulva, inguinal canal, urethra, bladder, cervix, uterus, right adnexa and left adnexa normal.     No posterior fourchette tenderness, injury or lesion present.     Vaginal bleeding present.     No cervical polyp.  HENT:     Head: Normocephalic and atraumatic.  Eyes:     General: No scleral icterus.    Conjunctiva/sclera:  Conjunctivae normal.  Neck:     Musculoskeletal: Normal range of motion and neck supple.  Cardiovascular:     Rate and Rhythm: Normal rate and regular rhythm.     Heart sounds: No murmur. No friction rub. No gallop.   Pulmonary:     Effort: Pulmonary effort is normal. No respiratory distress.     Breath sounds: Normal breath sounds. No wheezing or rales.  Abdominal:     General: Bowel sounds are normal. There is no distension.     Palpations: Abdomen is soft. There is no mass.     Tenderness: There is no abdominal tenderness. There is no guarding or rebound.  Musculoskeletal: Normal range of motion.  Neurological:     General: No focal deficit present.     Mental Status: She is alert and oriented to person, place, and time.     Cranial Nerves: No cranial nerve deficit.  Skin:    General: Skin is warm and dry.     Findings: No erythema.  Psychiatric:        Mood and Affect: Mood normal.        Behavior: Behavior normal.        Judgment: Judgment normal.     Female Chaperone present during breast and/or pelvic exam.  Assessment: 28 y.o. [id number] presenting for 6 week postpartum visit  Plan: Problem List Items Addressed This Visit    None    Visit Diagnoses    Postpartum care and examination    -  Primary   Relevant Medications   norgestimate-ethinyl estradiol (ORTHO-CYCLEN,SPRINTEC,PREVIFEM) 0.25-35 MG-MCG tablet   Encounter for initial prescription of contraceptive pills       Relevant Medications   norgestimate-ethinyl estradiol (ORTHO-CYCLEN,SPRINTEC,PREVIFEM) 0.25-35 MG-MCG tablet       1) Contraception Education given regarding options for contraception, including oral contraceptives.  2)  Pap - ASCCP guidelines and rational discussed.  Patient opts for routine screening interval  3) Patient underwent screening for postpartum depression with no concerns noted. SHe is doing well on her current dose of Celexa (20 mg).   4) Follow up 1 year for routine annual exam   Thomasene MohairStephen Livie Vanderhoof, MD 02/02/2019 10:03 AM

## 2019-02-03 ENCOUNTER — Ambulatory Visit: Payer: Self-pay | Admitting: Obstetrics and Gynecology

## 2019-02-21 ENCOUNTER — Other Ambulatory Visit: Payer: Self-pay | Admitting: Obstetrics and Gynecology

## 2019-02-21 DIAGNOSIS — F411 Generalized anxiety disorder: Secondary | ICD-10-CM

## 2019-02-21 DIAGNOSIS — O0990 Supervision of high risk pregnancy, unspecified, unspecified trimester: Secondary | ICD-10-CM

## 2019-02-21 MED ORDER — CITALOPRAM HYDROBROMIDE 20 MG PO TABS: 20.0000 mg | ORAL_TABLET | Freq: Every day | ORAL | 1 refills | Status: DC

## 2019-04-28 ENCOUNTER — Other Ambulatory Visit: Payer: Self-pay | Admitting: Obstetrics and Gynecology

## 2019-04-28 ENCOUNTER — Telehealth: Payer: Self-pay

## 2019-04-28 DIAGNOSIS — F411 Generalized anxiety disorder: Secondary | ICD-10-CM

## 2019-04-28 MED ORDER — CITALOPRAM HYDROBROMIDE 20 MG PO TABS: 20.0000 mg | ORAL_TABLET | Freq: Every day | ORAL | 2 refills | Status: DC

## 2019-04-28 NOTE — Telephone Encounter (Signed)
If she is doing well, I'll refill.  We need to consider whether she is able to come off the medication once it has been six months.  I'll send in a refill for a few months. Please let her know to make sure she lets me know, if the medication is not working. thanks

## 2019-04-28 NOTE — Telephone Encounter (Signed)
Pt needing refill on Celexa. Do you need her to schedule a follow up? Please advise

## 2019-04-28 NOTE — Telephone Encounter (Signed)
Pt aware.

## 2019-08-28 DIAGNOSIS — U071 COVID-19: Secondary | ICD-10-CM

## 2019-08-28 HISTORY — DX: COVID-19: U07.1

## 2019-10-22 IMAGING — CT CT MAXILLOFACIAL W/O CM
1 series · 16 of 30 positions shown, 20 images · non-contrast
Comparison: None.

CLINICAL DATA: Facial trauma.

EXAM:
CT MAXILLOFACIAL WITHOUT CONTRAST
TECHNIQUE: Multidetector CT imaging of the maxillofacial structures was
performed. Multiplanar CT image reconstructions were also generated.

[Series 2: max soft · axial · 0.34mm/px · z∈[-156,+6]mm · 16 of 89 slices shown, 20 images]
[im 4/89  brain]
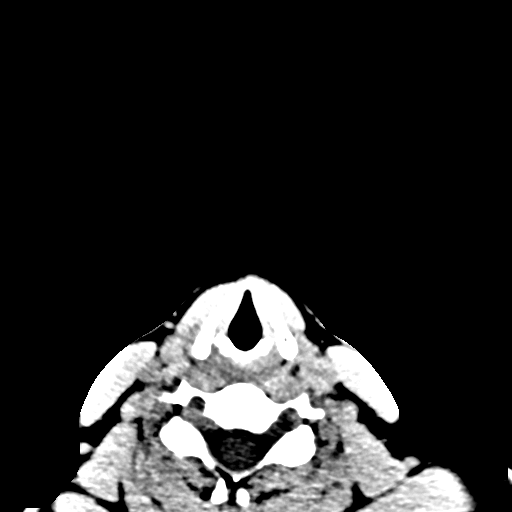
[im 4/89  bone]
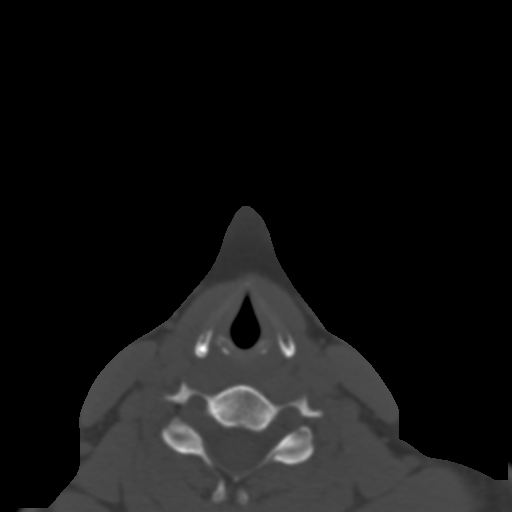
[im 10/89  bone]
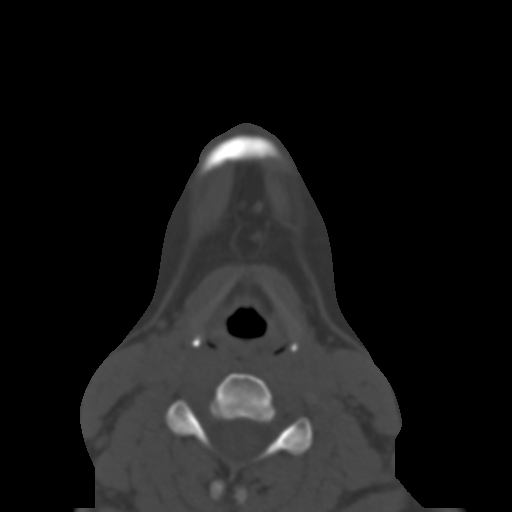
[im 16/89  bone]
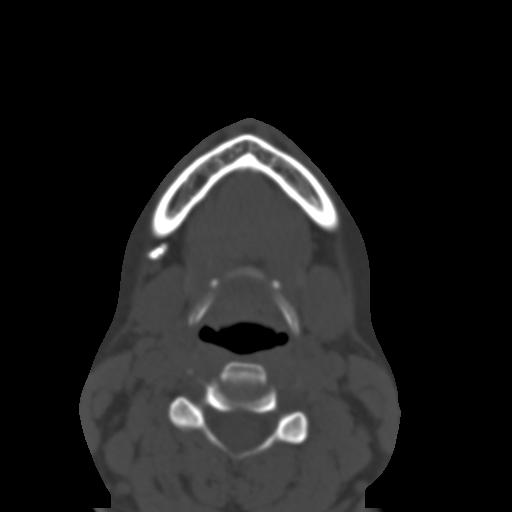
[im 22/89  bone]
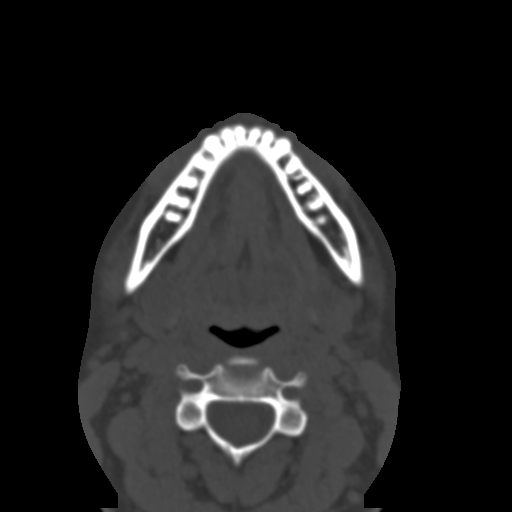
[im 25/89  brain]
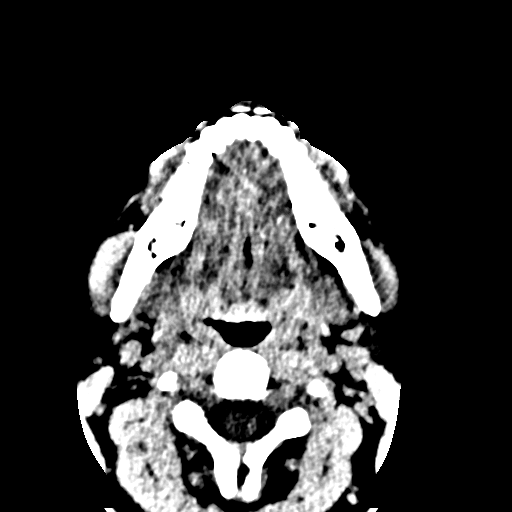
[im 25/89  bone]
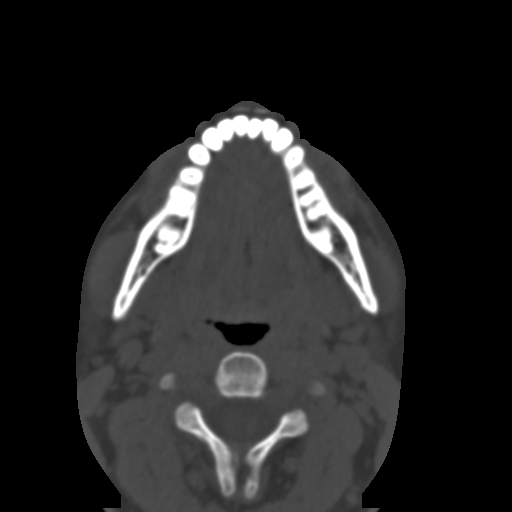
[im 31/89  bone]
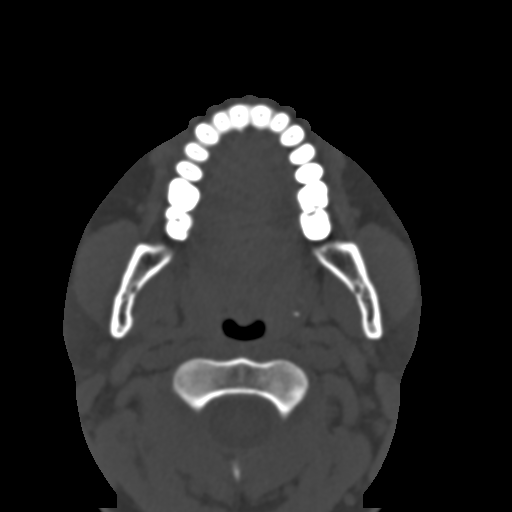
[im 37/89  bone]
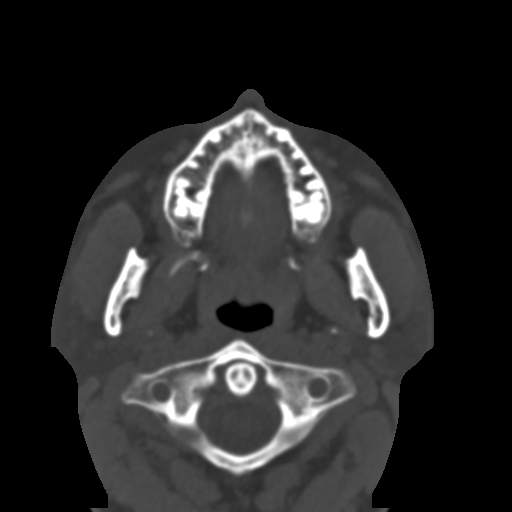
[im 43/89  bone]
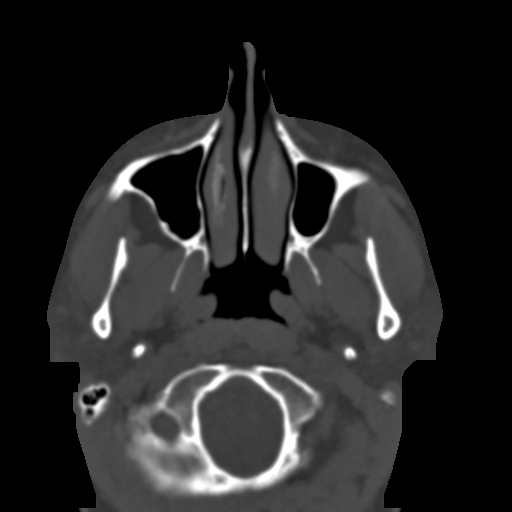
[im 46/89  brain]
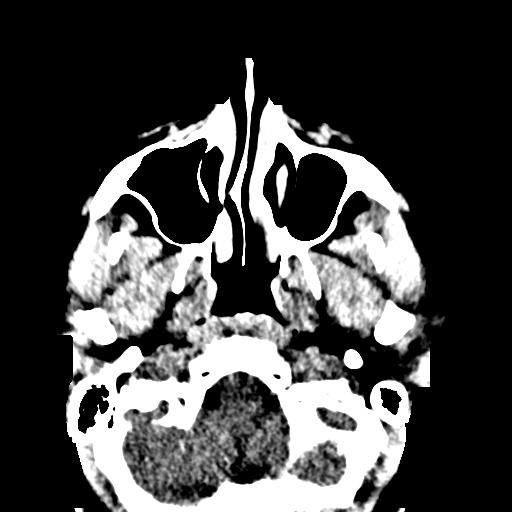
[im 46/89  bone]
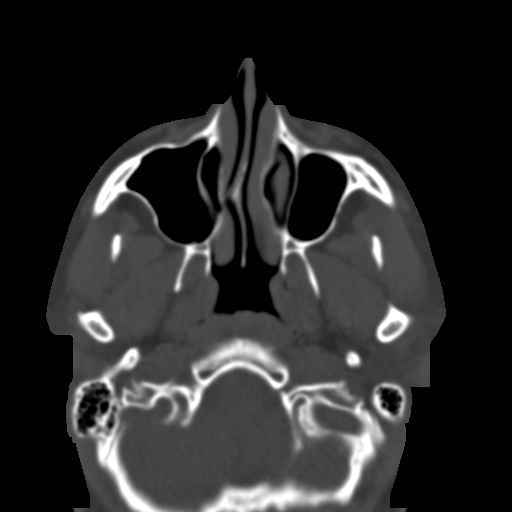
[im 52/89  bone]
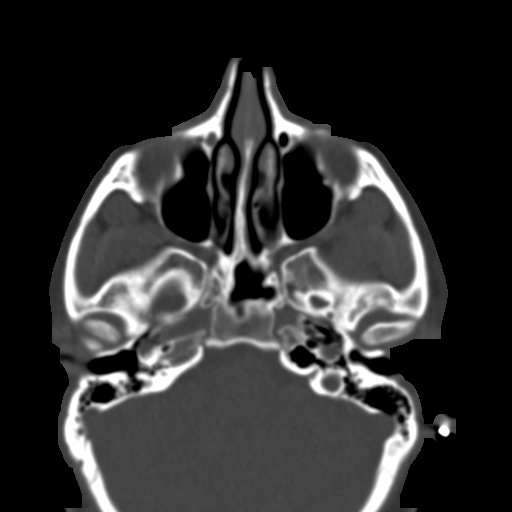
[im 58/89  bone]
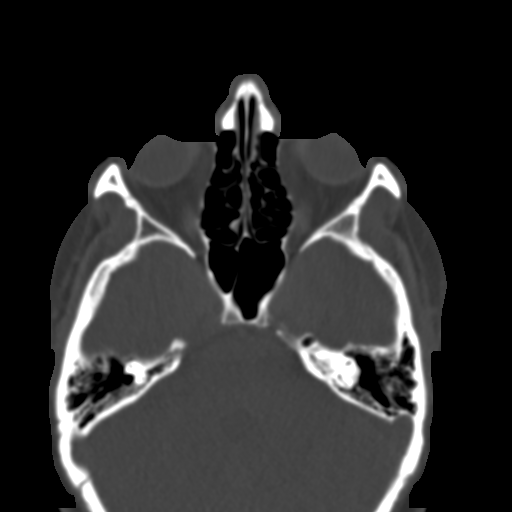
[im 64/89  bone]
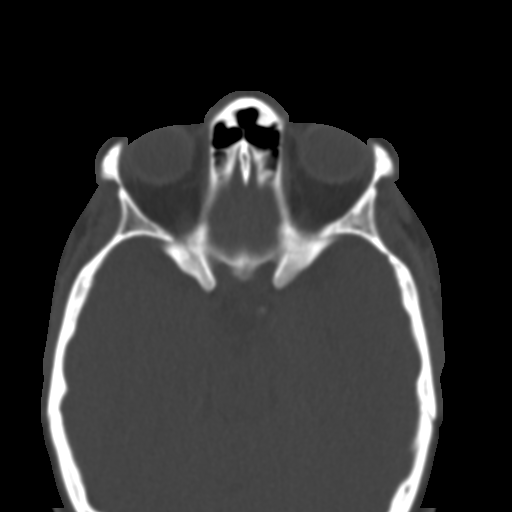
[im 67/89  brain]
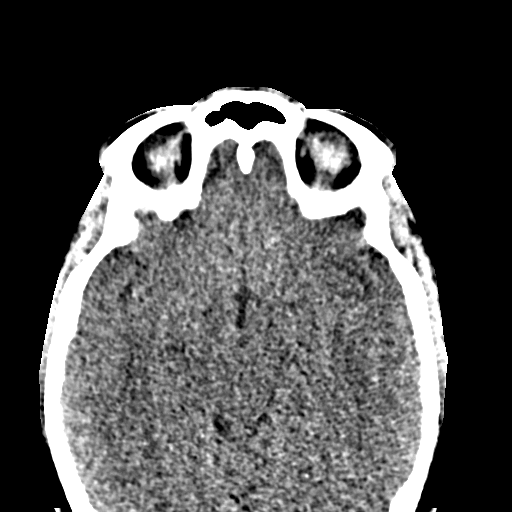
[im 67/89  bone]
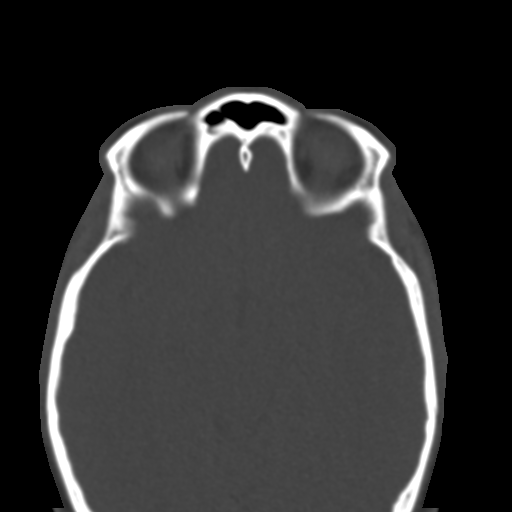
[im 73/89  bone]
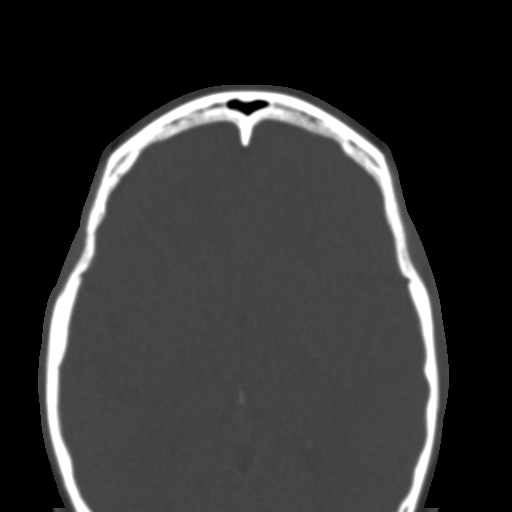
[im 79/89  bone]
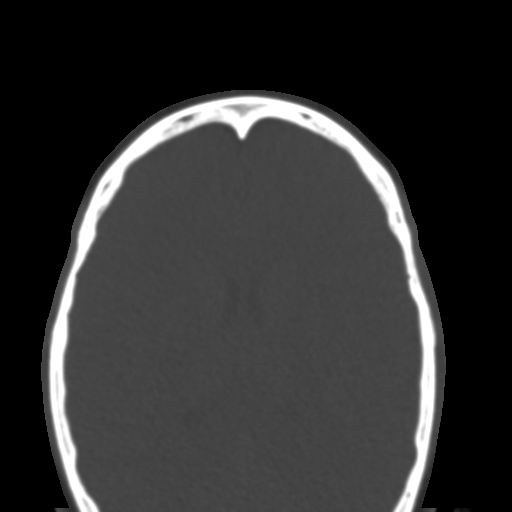
[im 85/89  bone]
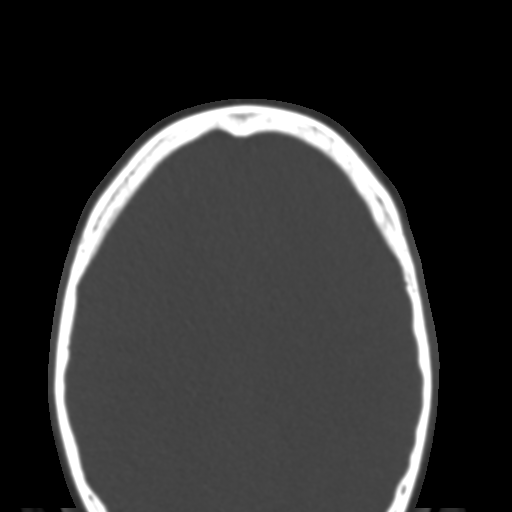

[16 of 30 positions shown; findings below may reference images not displayed]

FINDINGS: Osseous:

--Complex facial fracture types: No LeFort, zygomaticomaxillary
complex or nasoorbitoethmoidal fracture.

--Simple fracture types: None.

--Mandible, hard palate and teeth: No acute abnormality.

Orbits: The globes appear intact. Normal appearance of the intra-
and extraconal fat. Symmetric extraocular muscles.

Sinuses: No fluid levels or advanced mucosal thickening.

Soft tissues: Normal visualized extracranial soft tissues.

Limited intracranial: Normal.
IMPRESSION: No acute facial injury.

## 2020-03-27 IMAGING — CT CT ANGIO CHEST
2 of 6 series · 19 of 46 positions shown · IV contrast (APPLIED)
Comparison: Chest x-ray earlier today.

CLINICAL DATA: Twenty weeks pregnant. Mid sternal chest pain,
palpitations.

EXAM:
CT ANGIOGRAPHY CHEST WITH CONTRAST
TECHNIQUE: Multidetector CT imaging of the chest was performed using the
standard protocol during bolus administration of intravenous
contrast. Multiplanar CT image reconstructions and MIPs were
obtained to evaluate the vascular anatomy.
CONTRAST:  75mL OMNIPAQUE IOHEXOL 350 MG/ML SOLN

[Series 5: thins · axial · 0.75mm/px · z∈[-200,+2]mm · 17 of 221 slices shown]
[im 10/221  lung]
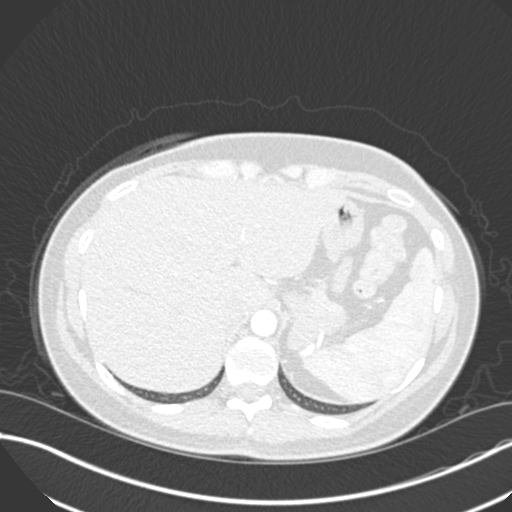
[im 20/221  soft-tissue]
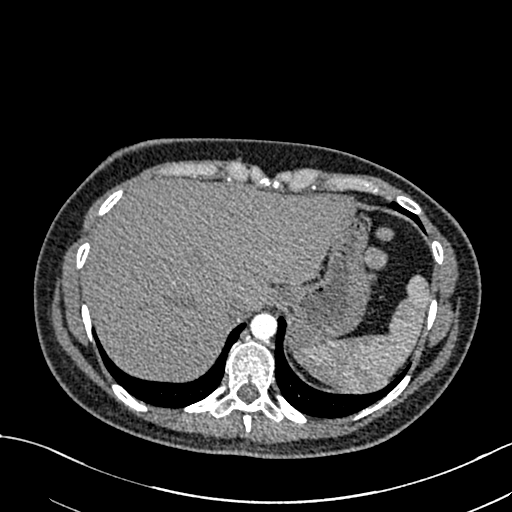
[im 39/221  lung]
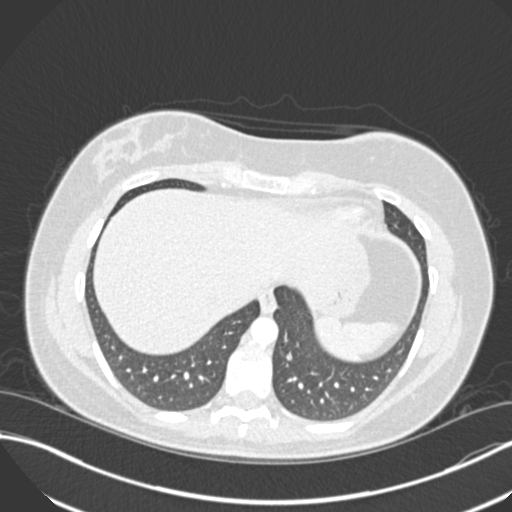
[im 48/221  soft-tissue]
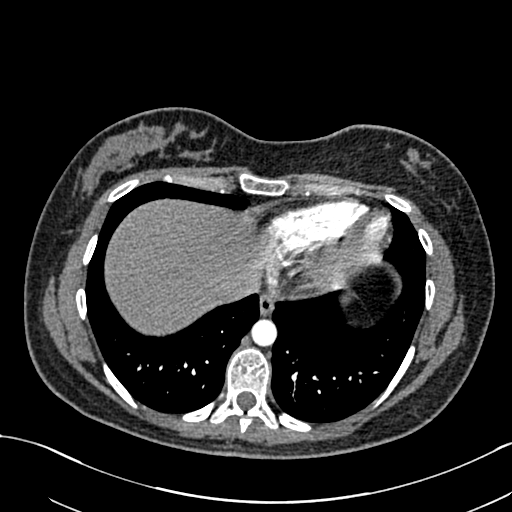
[im 58/221  lung]
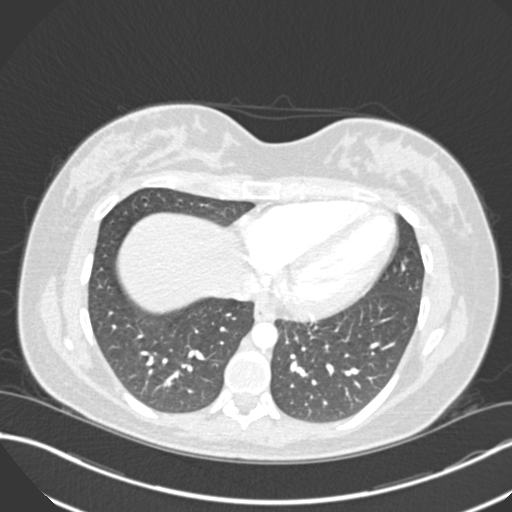
[im 77/221  soft-tissue]
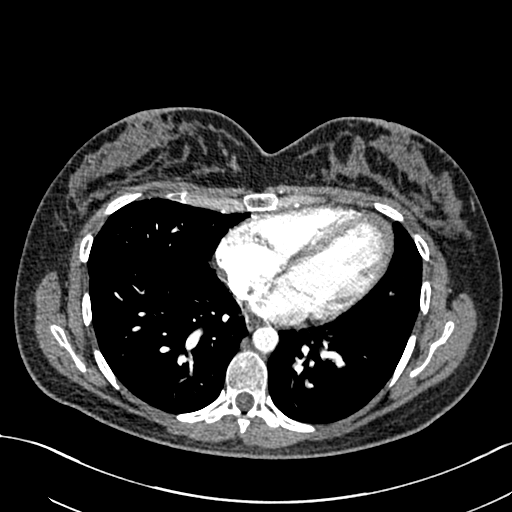
[im 87/221  lung]
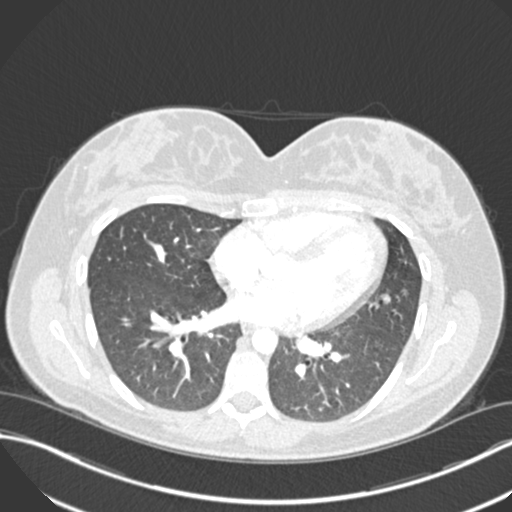
[im 96/221  soft-tissue]
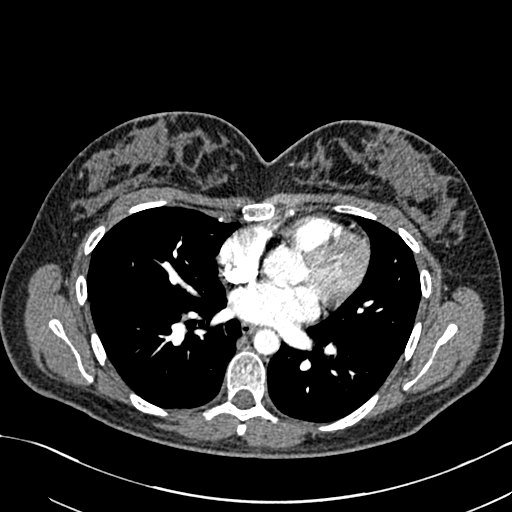
[im 115/221  lung]
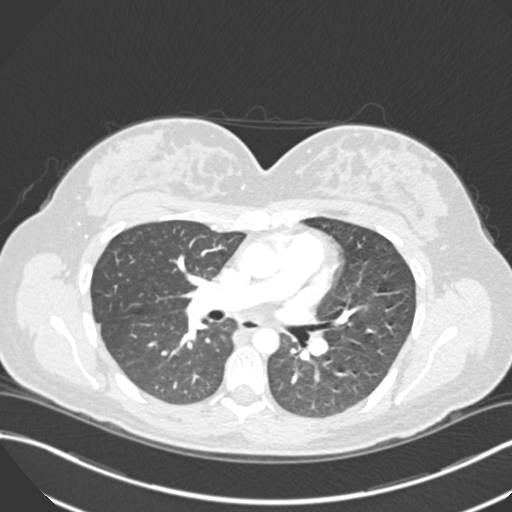
[im 125/221  soft-tissue]
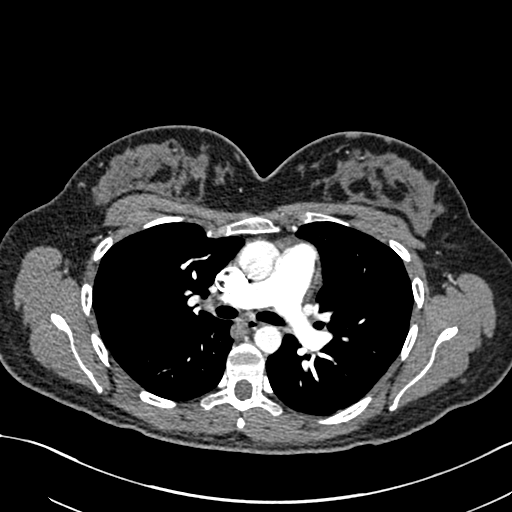
[im 134/221  lung]
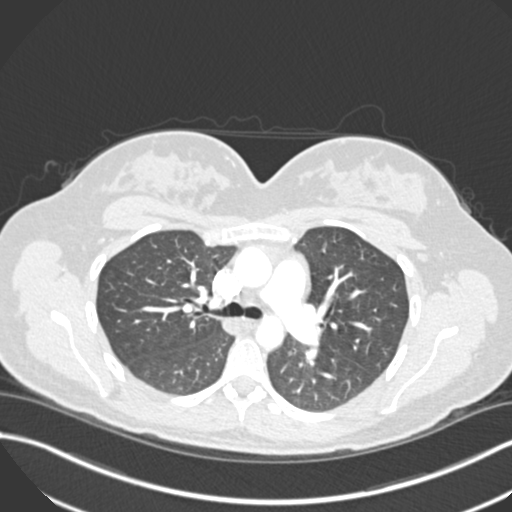
[im 144/221  soft-tissue]
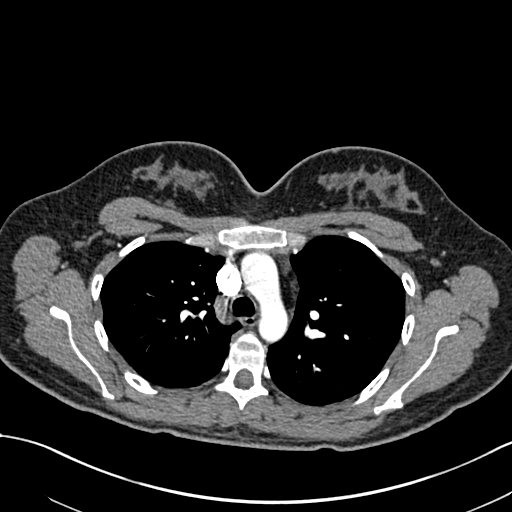
[im 163/221  lung]
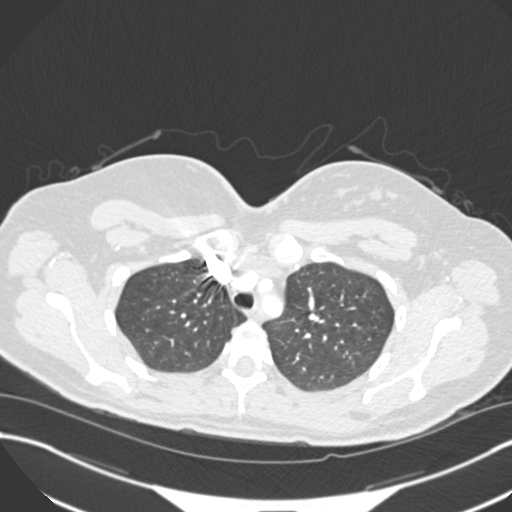
[im 173/221  soft-tissue]
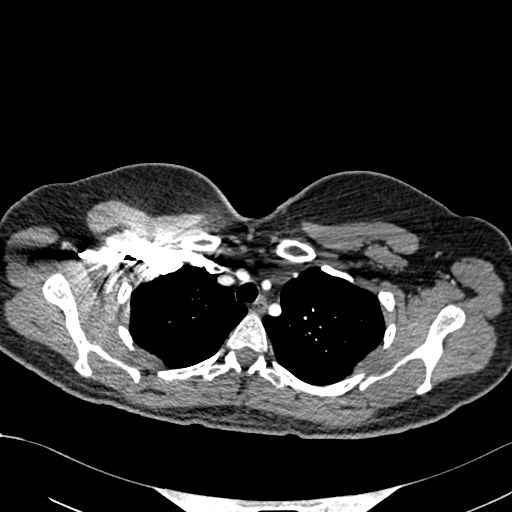
[im 182/221  lung]
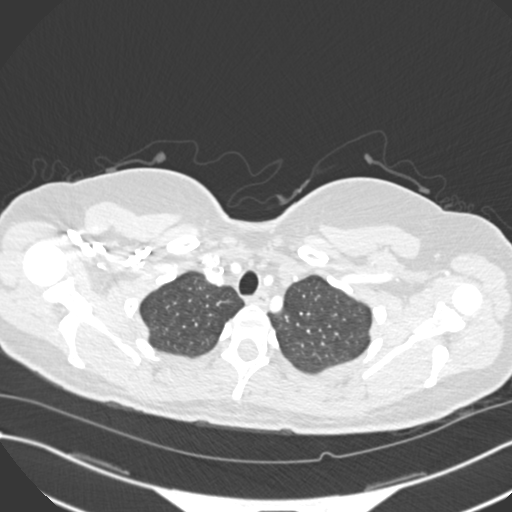
[im 201/221  soft-tissue]
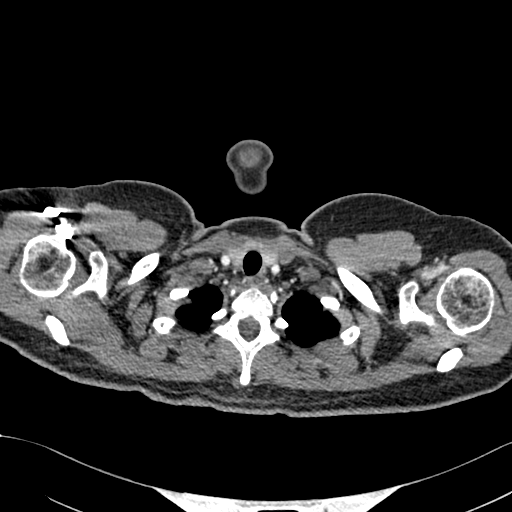
[im 211/221  lung]
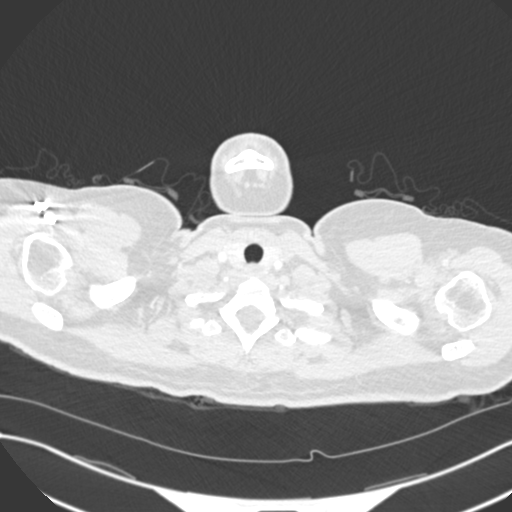

[Series 7: coronal mpr · coronal · 0.46mm/px · 2 of 72 slices shown]
[im 24/72  soft-tissue]
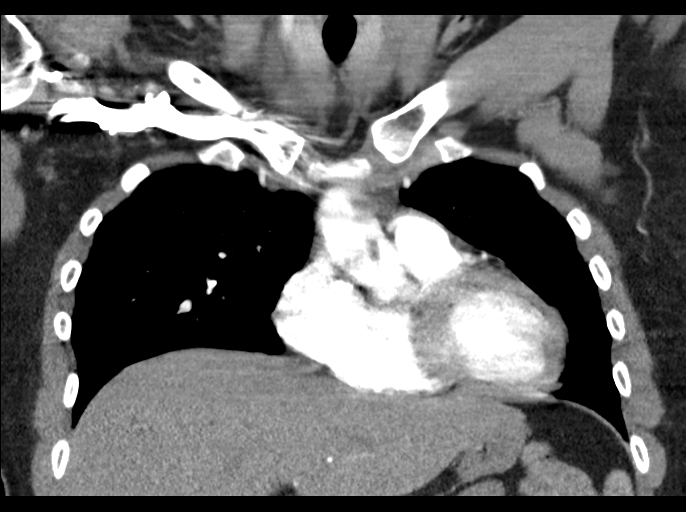
[im 48/72  soft-tissue]
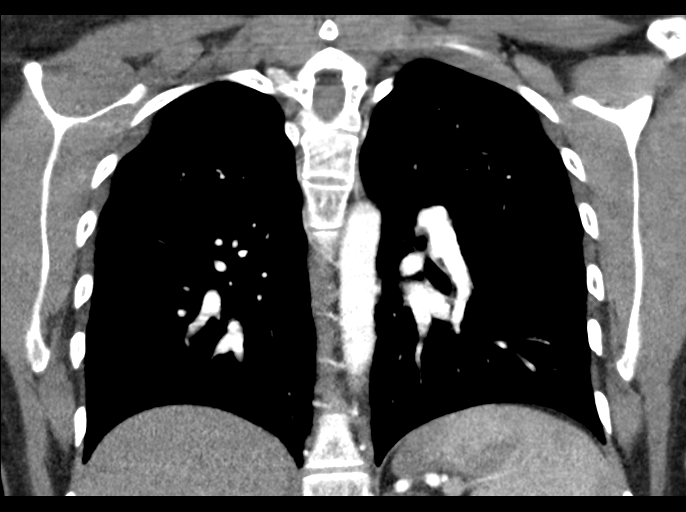

[19 of 46 positions shown; findings below may reference images not displayed]

FINDINGS: Cardiovascular: No filling defects in the pulmonary arteries to
suggest pulmonary emboli. Heart is normal size. Aorta is normal
caliber.

Mediastinum/Nodes: No mediastinal, hilar, or axillary adenopathy.

Lungs/Pleura: Lungs are clear. No focal airspace opacities or
suspicious nodules. No effusions.

Upper Abdomen: Imaging into the upper abdomen shows no acute
findings.

Musculoskeletal: Chest wall soft tissues are unremarkable. No acute
bony abnormality.

Review of the MIP images confirms the above findings.
IMPRESSION: No evidence of pulmonary embolus.

No acute cardiopulmonary disease.

## 2020-05-21 ENCOUNTER — Encounter: Payer: Self-pay | Admitting: Emergency Medicine

## 2020-05-21 ENCOUNTER — Other Ambulatory Visit: Payer: Self-pay

## 2020-05-21 ENCOUNTER — Ambulatory Visit
Admission: EM | Admit: 2020-05-21 | Discharge: 2020-05-21 | Disposition: A | Discharge status: Home / Self Care | Payer: HRSA Program | Attending: Family Medicine | Admitting: Family Medicine

## 2020-05-21 DIAGNOSIS — Z8616 Personal history of COVID-19: Secondary | ICD-10-CM | POA: Diagnosis not present

## 2020-05-21 DIAGNOSIS — H6691 Otitis media, unspecified, right ear: Secondary | ICD-10-CM | POA: Insufficient documentation

## 2020-05-21 DIAGNOSIS — Z20822 Contact with and (suspected) exposure to covid-19: Secondary | ICD-10-CM | POA: Diagnosis not present

## 2020-05-21 DIAGNOSIS — R059 Cough, unspecified: Secondary | ICD-10-CM

## 2020-05-21 DIAGNOSIS — J0110 Acute frontal sinusitis, unspecified: Secondary | ICD-10-CM | POA: Diagnosis not present

## 2020-05-21 DIAGNOSIS — J0100 Acute maxillary sinusitis, unspecified: Secondary | ICD-10-CM | POA: Diagnosis not present

## 2020-05-21 DIAGNOSIS — Z793 Long term (current) use of hormonal contraceptives: Secondary | ICD-10-CM | POA: Diagnosis not present

## 2020-05-21 DIAGNOSIS — R05 Cough: Secondary | ICD-10-CM | POA: Insufficient documentation

## 2020-05-21 MED ORDER — AMOXICILLIN-POT CLAVULANATE 875-125 MG PO TABS: 1.0000 | ORAL_TABLET | Freq: Two times a day (BID) | ORAL | 0 refills | Status: DC

## 2020-05-21 MED ORDER — HYDROCOD POLST-CPM POLST ER 10-8 MG/5ML PO SUER: 5.0000 mL | Freq: Every evening | ORAL | 0 refills | Status: DC | PRN

## 2020-05-21 NOTE — ED Provider Notes (Signed)
[facility] ____________________________________________  Time seen: Approximately 1:49 PM  I have reviewed the triage vital signs and the nursing notes.   HISTORY  Chief Complaint Cough, Nasal Congestion, and Ear Fullness   HPI Tiffany Wiley is a 29 y.o. female presented for evaluation of 2 weeks of cough and congestion complaints.  Reports others in her household have recently had similar but they have cleared.  States has been having left and right ear pain, currently right ear pain.  States occasional sore throat, no current sore throat.  Sinus pressure present.  Postnasal drainage.  Denies known fevers.  Denies chest pain or shortness of breath.  Continues overall eat and drink well.  Denies changes in taste or smell, vomiting, diarrhea or other recent sickness.  Has been taken over-the-counter cough congestion medication.  Patient's last menstrual period was 05/20/2020.  Denies pregnancy.  Not currently nursing.    Past Medical History:  Diagnosis Date   COVID-19 08/2019   Gestational diabetes    Medical history non-contributory    Recurrent upper respiratory infection (URI)    Urinary tract infection     Patient Active Problem List   Diagnosis Date Noted   Encounter for induction of labor 12/23/2018   Gestational diabetes 10/06/2018   UTI (urinary tract infection) during pregnancy 09/29/2018   Heart palpitations 08/13/2018   Tachycardia 08/13/2018   Nausea/vomiting in pregnancy 05/15/2018   Supervision of high risk pregnancy, antepartum 05/04/2018   Generalized anxiety disorder 09/30/2017    Past Surgical History:  Procedure Laterality Date   NO PAST SURGERIES       No current facility-administered medications for this encounter.  Current Outpatient Medications:    citalopram (CELEXA) 20 MG tablet, Take 1 tablet (20 mg total) by mouth daily., Disp: 30 tablet, Rfl: 2   amoxicillin-clavulanate (AUGMENTIN) 875-125 MG tablet, Take 1  tablet by mouth every 12 (twelve) hours., Disp: 20 tablet, Rfl: 0   chlorpheniramine-HYDROcodone (TUSSIONEX PENNKINETIC ER) 10-8 MG/5ML SUER, Take 5 mLs by mouth at bedtime as needed for cough. do not drive or operate machinery while taking as can cause drowsiness., Disp: 50 mL, Rfl: 0   norgestimate-ethinyl estradiol (ORTHO-CYCLEN,SPRINTEC,PREVIFEM) 0.25-35 MG-MCG tablet, Take 1 tablet by mouth daily., Disp: 3 Package, Rfl: 4   Prenatal Vit-Fe Fumarate-FA (PRENATAL MULTIVITAMIN) TABS tablet, Take 1 tablet by mouth daily at 12 noon., Disp: , Rfl:   Allergies Other and Shellfish allergy  Family History  Problem Relation Age of Onset   Breast cancer Other    Healthy Mother     Social History Social History   Tobacco Use   Smoking status: Never Smoker   Smokeless tobacco: Never Used  Vaping Use   Vaping Use: Never used  Substance Use Topics   Alcohol use: No   Drug use: No    Review of Systems Constitutional: No fever ENT: As above.  Cardiovascular: Denies chest pain. Respiratory: Denies shortness of breath. Gastrointestinal: No abdominal pain.  No nausea, no vomiting.  No diarrhea.   Musculoskeletal: Negative for back pain. Skin: Negative for rash.  ____________________________________________   PHYSICAL EXAM:  VITAL SIGNS: ED Triage Vitals  Enc Vitals Group     BP 05/21/20 1300 (!) 138/84     Pulse Rate 05/21/20 1300 71     Resp 05/21/20 1300 18     Temp 05/21/20 1300 98.3 F (36.8 C)     Temp Source 05/21/20 1300 Oral     SpO2 05/21/20 1300 98 %  Weight 05/21/20 1257 170 lb (77.1 kg)     Height 05/21/20 1257 5\' 3"  (1.6 m)     Head Circumference --      Peak Flow --      Pain Score 05/21/20 1256 0     Pain Loc --      Pain Edu? --      Excl. in GC? --     Constitutional: Alert and oriented. Well appearing and in no acute distress. Eyes: Conjunctivae are normal Head: Atraumatic.Mild to moderate tenderness to palpation bilateral frontal and  maxillary sinuses. No swelling. No erythema.   Ears: Right: Nontender, normal canal, moderate erythema dull TM.  Left: Nontender, normal canal, mild effusion, nonerythematous TM, otherwise normal TM.  Nose: nasal congestion with bilateral nasal turbinate erythema and edema.   Mouth/Throat: Mucous membranes are moist.  Oropharynx non-erythematous.No tonsillar swelling or exudate.  Neck: No stridor.  No cervical spine tenderness to palpation. Hematological/Lymphatic/Immunilogical: No cervical lymphadenopathy. Cardiovascular: Normal rate, regular rhythm. Grossly normal heart sounds.  Good peripheral circulation. Respiratory: Normal respiratory effort.  No retractions. No wheezes, rales or rhonchi. Good air movement.  Musculoskeletal: Steady gait.  Neurologic:  Normal speech and language. No gait instability. Skin:  Skin is warm, dry and intact. No rash noted. Psychiatric: Mood and affect are normal. Speech and behavior are normal.  ___________________________________________   LABS (all labs ordered are listed, but only abnormal results are displayed)  Labs Reviewed  SARS CORONAVIRUS 2 (TAT 6-24 HRS)    PROCEDURES Procedures     INITIAL IMPRESSION / ASSESSMENT AND PLAN / ED COURSE  Pertinent labs & imaging results that were available during my care of the patient were reviewed by me and considered in my medical decision making (see chart for details).  Well-appearing patient.  No acute distress.  Suspect recent viral illness.  COVID-19 testing ordered.  Secondary sinusitis and otitis.  Will treat with oral Augmentin.  Parent Tussionex.  Encourage rest, fluids, supportive care.Discussed indication, risks and benefits of medications with patient.   Discussed follow up with Primary care physician this week. Discussed follow up and return parameters including no resolution or any worsening concerns. Patient verbalized understanding and agreed to plan.    ____________________________________________   FINAL CLINICAL IMPRESSION(S) / ED DIAGNOSES  Final diagnoses:  Acute maxillary sinusitis, recurrence not specified  Acute frontal sinusitis, recurrence not specified  Right otitis media, unspecified otitis media type  Cough     ED Discharge Orders         Ordered    amoxicillin-clavulanate (AUGMENTIN) 875-125 MG tablet  Every 12 hours     Discontinue  Reprint     05/21/20 1342    chlorpheniramine-HYDROcodone (TUSSIONEX PENNKINETIC ER) 10-8 MG/5ML SUER  At bedtime PRN     Discontinue  Reprint     05/21/20 1348           Note: This dictation was prepared with Dragon dictation along with smaller phrase technology. Any transcriptional errors that result from this process are unintentional.         05/23/20, NP 05/21/20 1629

## 2020-05-21 NOTE — Discharge Instructions (Addendum)
Take medication as prescribed. Rest. Drink plenty of fluids.  ° °Follow up with your primary care physician this week as needed. Return to Urgent care for new or worsening concerns.  ° °

## 2020-05-21 NOTE — ED Triage Notes (Signed)
Patient c/o cough, congestion, ear pain that started 2 weeks ago. Denies fever.

## 2020-05-22 LAB — SARS CORONAVIRUS 2 (TAT 6-24 HRS): SARS Coronavirus 2: NEGATIVE

## 2020-06-13 ENCOUNTER — Encounter: Payer: Self-pay | Admitting: Emergency Medicine

## 2020-06-13 ENCOUNTER — Ambulatory Visit
Admission: EM | Admit: 2020-06-13 | Discharge: 2020-06-13 | Disposition: A | Discharge status: Home / Self Care | Payer: Self-pay | Attending: Internal Medicine | Admitting: Internal Medicine

## 2020-06-13 ENCOUNTER — Other Ambulatory Visit: Payer: Self-pay

## 2020-06-13 DIAGNOSIS — H6593 Unspecified nonsuppurative otitis media, bilateral: Secondary | ICD-10-CM

## 2020-06-13 MED ORDER — GUAIFENESIN ER 600 MG PO TB12: 600.0000 mg | ORAL_TABLET | Freq: Two times a day (BID) | ORAL | 0 refills | Status: AC

## 2020-06-13 NOTE — ED Triage Notes (Signed)
Patient c/o bilateral ear pain that started 2 days ago. She was on an antibiotic 3 weeks ago for an ear infection and it resolved but now the pain has returned.

## 2020-06-13 NOTE — Discharge Instructions (Signed)
You do not have ear infection. You have fluid in your middle ear That will resolve over time if you continue taking Mucinex.

## 2020-06-13 NOTE — ED Provider Notes (Signed)
[facility]    CSN: [id number] Arrival date & time: 06/13/20  1549      History   Chief Complaint Chief Complaint  Patient presents with  . Otalgia    HPI Tiffany Wiley is a 29 y.o. female comes to the urgent care with 2-day history of bilateral ear ache and a feeling of fullness in both the ears.  Patient was recently treated for otitis media.  She completed the course of Augmentin.  Patient denies any dizziness or vertigo.  No nausea, vomiting or lightheadedness.  No fever or chills.   HPI  Past Medical History:  Diagnosis Date  . COVID-19 08/2019  . Gestational diabetes   . Medical history non-contributory   . Recurrent upper respiratory infection (URI)   . Urinary tract infection     Patient Active Problem List   Diagnosis Date Noted  . Encounter for induction of labor 12/23/2018  . Gestational diabetes 10/06/2018  . UTI (urinary tract infection) during pregnancy 09/29/2018  . Heart palpitations 08/13/2018  . Tachycardia 08/13/2018  . Nausea/vomiting in pregnancy 05/15/2018  . Supervision of high risk pregnancy, antepartum 05/04/2018  . Generalized anxiety disorder 09/30/2017    Past Surgical History:  Procedure Laterality Date  . NO PAST SURGERIES      OB History    Gravida  2   Para  2   Term  2   Preterm  0   AB  0   Living  2     SAB  0   TAB  0   Ectopic  0   Multiple  0   Live Births  2            Home Medications    Prior to Admission medications   Medication Sig Start Date End Date Taking? Authorizing Provider  citalopram (CELEXA) 20 MG tablet Take 1 tablet (20 mg total) by mouth daily. 04/28/19  Yes Conard Novak, MD  norgestimate-ethinyl estradiol (ORTHO-CYCLEN,SPRINTEC,PREVIFEM) 0.25-35 MG-MCG tablet Take 1 tablet by mouth daily. 02/02/19  Yes Conard Novak, MD  guaiFENesin (MUCINEX) 600 MG 12 hr tablet Take 1 tablet (600 mg total) by mouth 2 (two) times daily for 15 days. 06/13/20 06/28/20  Merrilee Jansky, MD    Family History Family History  Problem Relation Age of Onset  . Breast cancer Other   . Healthy Mother     Social History Social History   Tobacco Use  . Smoking status: Never Smoker  . Smokeless tobacco: Never Used  Vaping Use  . Vaping Use: Never used  Substance Use Topics  . Alcohol use: No  . Drug use: No     Allergies   Other and Shellfish allergy   Review of Systems Review of Systems  Constitutional: Negative.   HENT: Positive for ear pain. Negative for ear discharge, sinus pressure and sinus pain.   Eyes: Negative.   Cardiovascular: Negative.   Gastrointestinal: Negative.   Neurological: Negative.  Negative for dizziness, light-headedness and headaches.     Physical Exam Triage Vital Signs ED Triage Vitals  Enc Vitals Group     BP 06/13/20 1621 140/90     Pulse Rate 06/13/20 1621 82     Resp 06/13/20 1621 18     Temp 06/13/20 1621 97.8 F (36.6 C)     Temp Source 06/13/20 1621 Oral     SpO2 06/13/20 1621 98 %     Weight 06/13/20 1618 169 lb 15.6 oz (77.1  kg)     Height 06/13/20 1618 5\' 3"  (1.6 m)     Head Circumference --      Peak Flow --      Pain Score 06/13/20 1618 2     Pain Loc --      Pain Edu? --      Excl. in GC? --    No data found.  Updated Vital Signs BP 140/90 (BP Location: Right Arm)   Pulse 82   Temp 97.8 F (36.6 C) (Oral)   Resp 18   Ht 5\' 3"  (1.6 m)   Wt 77.1 kg   LMP 05/20/2020   SpO2 98%   BMI 30.11 kg/m   Visual Acuity Right Eye Distance:   Left Eye Distance:   Bilateral Distance:    Right Eye Near:   Left Eye Near:    Bilateral Near:     Physical Exam Vitals and nursing note reviewed.  Constitutional:      General: She is not in acute distress.    Appearance: She is not ill-appearing.  HENT:     Right Ear: Tympanic membrane normal.     Left Ear: Tympanic membrane normal.     Ears:     Comments: Middle ear effusions bilaterally. Cardiovascular:     Rate and Rhythm: Normal rate and  regular rhythm.     Pulses: Normal pulses.     Heart sounds: Normal heart sounds.  Pulmonary:     Effort: Pulmonary effort is normal.     Breath sounds: Normal breath sounds.  Skin:    Capillary Refill: Capillary refill takes less than 2 seconds.  Neurological:     Mental Status: She is alert.      UC Treatments / Results  Labs (all labs ordered are listed, but only abnormal results are displayed) Labs Reviewed - No data to display  EKG   Radiology No results found.  Procedures Procedures (including critical care time)  Medications Ordered in UC Medications - No data to display  Initial Impression / Assessment and Plan / UC Course  I have reviewed the triage vital signs and the nursing notes.  Pertinent labs & imaging results that were available during my care of the patient were reviewed by me and considered in my medical decision making (see chart for details).     1.  Bilateral middle ear effusions: No evidence of otitis media Humibid 600 mg twice daily Reassurance given to the patient No indication for antibiotics Return precautions given Patient verbalized understanding of the plan of care. Final Clinical Impressions(s) / UC Diagnoses   Final diagnoses:  Middle ear effusion, bilateral     Discharge Instructions     You do not have ear infection. You have fluid in your middle ear That will resolve over time if you continue taking Mucinex.    ED Prescriptions    Medication Sig Dispense Auth. Provider   guaiFENesin (MUCINEX) 600 MG 12 hr tablet Take 1 tablet (600 mg total) by mouth 2 (two) times daily for 15 days. 30 tablet Tinaya Ceballos, , MD     PDMP not reviewed this encounter.   05/22/2020, MD 06/14/20 1110

## 2020-10-27 NOTE — L&D Delivery Note (Signed)
Delivery Summary for Tiffany Wiley  Labor Events:   Preterm labor: No data found  Rupture date: 09/12/2021  Rupture time: 5:00 PM  Rupture type: Spontaneous Possible ROM - for evaluation  Fluid Color: Clear Bloody  Induction: No data found  Augmentation: No data found  Complications: No data found  Cervical ripening: No data found No data found   No data found     Delivery:   Episiotomy: No data found  Lacerations: No data found  Repair suture: No data found  Repair # of packets: No data found  Blood loss (ml): 150 ml   Information for the patient's newborn:  Tailey, Top [[id number]]   Delivery 09/13/2021 5:45 AM by  Vaginal, Spontaneous Sex:  female Gestational Age: [redacted]w[redacted]d Delivery Clinician:   Living?:         APGARS  One minute Five minutes Ten minutes  Skin color:        Heart rate:        Grimace:        Muscle tone:        Breathing:        Totals: 8  10      Presentation/position:      Resuscitation:   Cord information:    Disposition of cord blood:     Blood gases sent?  Complications:   Placenta: Delivered:       appearance Newborn Measurements: Weight: 6 lb 13.7 oz (3110 g)  Height: 20.08"  Head circumference:    Chest circumference:    Other providers:    Additional  information: Forceps:   Vacuum:   Breech:   Observed anomalies      Delivery Note At 5:45 AM a viable and healthy female was delivered via Vaginal, Spontaneous (Presentation:  Vertex; LOA position).  APGAR: 8, 10; weight 6 lb 13.7 oz (3110 g).   Placenta status: Spontaneous, Intact.  Cord: 3 vessels with the following complications: Short.  Cord pH: not obtained.  Delayed cord clamping observed.   Anesthesia: Epidural Episiotomy: None Lacerations:  None Suture Repair:  None Est. Blood Loss (mL): 50  Mom to postpartum.  Baby to Couplet care / Skin to Skin.  Hildred Laser, MD 09/13/2021, 8:52 AM

## 2021-02-19 ENCOUNTER — Encounter: Payer: Self-pay | Admitting: Obstetrics and Gynecology

## 2021-02-19 ENCOUNTER — Ambulatory Visit (INDEPENDENT_AMBULATORY_CARE_PROVIDER_SITE_OTHER): Payer: Self-pay | Admitting: Obstetrics and Gynecology

## 2021-02-19 ENCOUNTER — Other Ambulatory Visit: Payer: Self-pay

## 2021-02-19 VITALS — BP 131/89 | HR 99 | Ht 63.0 in | Wt 168.2 lb

## 2021-02-19 DIAGNOSIS — Z7689 Persons encountering health services in other specified circumstances: Secondary | ICD-10-CM

## 2021-02-19 DIAGNOSIS — Z3200 Encounter for pregnancy test, result unknown: Secondary | ICD-10-CM

## 2021-02-19 NOTE — Progress Notes (Signed)
HPI:      [name] is a 30 y.o. G3P2002 who LMP was Patient's last menstrual period was 12/03/2020.  Subjective:   She presents today for pregnancy confirmation.  Patient has missed a menstrual period and based on LMP is [redacted] weeks gestation.  She thinks that she may not be this far along based on her symptoms of nausea and vomiting.  She is currently taking prenatal vitamins. Her 2 prior pregnancies were vaginal births.  Her second child weighed over 9 pounds and her pregnancy was complicated by insulin-dependent gestational diabetes.    Hx: The following portions of the patient's history were reviewed and updated as appropriate:             She  has a past medical history of COVID-19 (08/2019), Gestational diabetes, Medical history non-contributory, Recurrent upper respiratory infection (URI), and Urinary tract infection. She does not have any pertinent problems on file. She  has a past surgical history that includes No past surgeries. Her family history includes Breast cancer in an other family member; Healthy in her mother. She  reports that she has never smoked. She has never used smokeless tobacco. She reports that she does not drink alcohol and does not use drugs. She has a current medication list which includes the following prescription(s): multivitamin-prenatal. She is allergic to other and shellfish allergy.       Review of Systems:  Review of Systems  Constitutional: Denied constitutional symptoms, night sweats, recent illness, fatigue, fever, insomnia and weight loss.  Eyes: Denied eye symptoms, eye pain, photophobia, vision change and visual disturbance.  Ears/Nose/Throat/Neck: Denied ear, nose, throat or neck symptoms, hearing loss, nasal discharge, sinus congestion and sore throat.  Cardiovascular: Denied cardiovascular symptoms, arrhythmia, chest pain/pressure, edema, exercise intolerance, orthopnea and palpitations.  Respiratory: Denied pulmonary symptoms, asthma,  pleuritic pain, productive sputum, cough, dyspnea and wheezing.  Gastrointestinal: Denied, gastro-esophageal reflux, melena, nausea and vomiting.  Genitourinary: Denied genitourinary symptoms including symptomatic vaginal discharge, pelvic relaxation issues, and urinary complaints.  Musculoskeletal: Denied musculoskeletal symptoms, stiffness, swelling, muscle weakness and myalgia.  Dermatologic: Denied dermatology symptoms, rash and scar.  Neurologic: Denied neurology symptoms, dizziness, headache, neck pain and syncope.  Psychiatric: Denied psychiatric symptoms, anxiety and depression.  Endocrine: Denied endocrine symptoms including hot flashes and night sweats.   Meds:   Current Outpatient Medications on File Prior to Visit  Medication Sig Dispense Refill  . Prenatal Vit-Fe Fumarate-FA (MULTIVITAMIN-PRENATAL) 27-0.8 MG TABS tablet Take 1 tablet by mouth daily at 12 noon.     No current facility-administered medications on file prior to visit.          Objective:     Vitals:   02/19/21 0959  BP: 131/89  Pulse: 99   Filed Weights   02/19/21 0959  Weight: 168 lb 3.2 oz (76.3 kg)                Assessment:    [id number] Patient Active Problem List   Diagnosis Date Noted  . Encounter for induction of labor 12/23/2018  . Gestational diabetes 10/06/2018  . UTI (urinary tract infection) during pregnancy 09/29/2018  . Heart palpitations 08/13/2018  . Tachycardia 08/13/2018  . Nausea/vomiting in pregnancy 05/15/2018  . Supervision of high risk pregnancy, antepartum 05/04/2018  . Generalized anxiety disorder 09/30/2017     1. Possible pregnancy, not confirmed   2. Encounter to establish care     Patient approximately 11 weeks estimated gestational age by LMP  History of insulin-dependent  gestational diabetes.   Plan:            Prenatal Plan 1.  The patient was given prenatal literature. 2.  She was continued on prenatal vitamins. 3.  A prenatal lab panel to be  drawn at nurse visit. 4.  An ultrasound was ordered to better determine an EDC. 5.  A nurse visit was scheduled. 6.  Genetic testing and testing for other inheritable conditions discussed in detail. She will decide in the future whether to have these labs performed. 7.  A general overview of pregnancy testing, visit schedule, ultrasound schedule, and prenatal care was discussed. 8.  COVID and its risks associated with pregnancy, prevention by limiting exposure and use of masks, as well as the risks and benefits of vaccination during pregnancy were discussed in detail.  [facility] policy regarding office and hospital visitation and testing was explained. 9.  Benefits of breast-feeding discussed in detail including both maternal and infant benefits. Ready Set Baby website discussed. 10.  Literature given for nausea and vomiting of pregnancy 11.  Recommend early 1 hour GCT at 16 to 18 weeks.   Orders Orders Placed This Encounter  Procedures  . US OB Comp Less 14 Wks    No orders of the defined types were placed in this encounter.     F/U  Return in about 2 weeks (around 03/05/2021). I spent 33 minutes involved in the care of this patient preparing to see the patient by obtaining and reviewing her medical history (including labs, imaging tests and prior procedures), documenting clinical information in the electronic health record (EHR), counseling and coordinating care plans, writing and sending prescriptions, ordering tests or procedures and directly communicating with the patient by discussing pertinent items from her history and physical exam as well as detailing my assessment and plan as noted above so that she has an informed understanding.  All of her questions were answered.  Elonda Husky, M.D. 02/19/2021 10:22 AM

## 2021-03-08 ENCOUNTER — Other Ambulatory Visit: Payer: Self-pay

## 2021-03-08 ENCOUNTER — Ambulatory Visit (INDEPENDENT_AMBULATORY_CARE_PROVIDER_SITE_OTHER): Payer: Self-pay | Admitting: Surgical

## 2021-03-08 VITALS — BP 120/84 | HR 86 | Ht 63.0 in | Wt 161.0 lb

## 2021-03-08 DIAGNOSIS — Z3491 Encounter for supervision of normal pregnancy, unspecified, first trimester: Secondary | ICD-10-CM

## 2021-03-08 NOTE — Progress Notes (Signed)
Tiffany Wiley presents for NOB nurse interview visit. Pregnancy confirmation done 02/19/2021. G3. P2002. Pregnancy education material explained and given. _0_ cats in home. NOB labs ordered. HIV labs and drug screen were explained and ordered. PNV encouraged. Genetic screening options discussed. Genetic testing: Unsure. Patient may discuss with the provider. Patient to follow up with provider on 03/11/2021 for NOB physical. All questions answered. Patient signed FMLA and drug screen form. Patient had no questions. Patient had GDM last pregnancy.

## 2021-03-08 NOTE — Patient Instructions (Signed)
Common Medications Safe in Pregnancy  Acne:      Constipation:  Benzoyl Peroxide     Colace  Clindamycin      Dulcolax Suppository  Topica Erythromycin     Fibercon  Salicylic Acid      Metamucil         Miralax AVOID:        Senakot   Accutane    Cough:  Retin-A       Cough Drops  Tetracycline      Phenergan w/ Codeine if Rx  Minocycline      Robitussin (Plain & DM)  Antibiotics:     Crabs/Lice:  Ceclor       RID  Cephalosporins    AVOID:  E-Mycins      Kwell  Keflex  Macrobid/Macrodantin   Diarrhea:  Penicillin      Kao-Pectate  Zithromax      Imodium AD         PUSH FLUIDS AVOID:       Cipro     Fever:  Tetracycline      Tylenol (Regular or Extra  Minocycline       Strength)  Levaquin      Extra Strength-Do not          Exceed 8 tabs/24 hrs Caffeine:        <200mg/day (equiv. To 1 cup of coffee or  approx. 3 12 oz sodas)         Gas: Cold/Hayfever:       Gas-X  Benadryl      Mylicon  Claritin       Phazyme  **Claritin-D        Chlor-Trimeton    Headaches:  Dimetapp      ASA-Free Excedrin  Drixoral-Non-Drowsy     Cold Compress  Mucinex (Guaifenasin)     Tylenol (Regular or Extra  Sudafed/Sudafed-12 Hour     Strength)  **Sudafed PE Pseudoephedrine   Tylenol Cold & Sinus     Vicks Vapor Rub  Zyrtec  **AVOID if Problems With Blood Pressure         Heartburn: Avoid lying down for at least 1 hour after meals  Aciphex      Maalox     Rash:  Milk of Magnesia     Benadryl    Mylanta       1% Hydrocortisone Cream  Pepcid  Pepcid Complete   Sleep Aids:  Prevacid      Ambien   Prilosec       Benadryl  Rolaids       Chamomile Tea  Tums (Limit 4/day)     Unisom         Tylenol PM         Warm milk-add vanilla or  Hemorrhoids:       Sugar for taste  Anusol/Anusol H.C.  (RX: Analapram 2.5%)  Sugar Substitutes:  Hydrocortisone OTC     Ok in moderation  Preparation H      Tucks        Vaseline lotion applied to tissue with  wiping    Herpes:     Throat:  Acyclovir      Oragel  Famvir  Valtrex     Vaccines:         Flu Shot Leg Cramps:       *Gardasil  Benadryl      Hepatitis A         Hepatitis B Nasal Spray:         Pneumovax  Saline Nasal Spray     Polio Booster         Tetanus Nausea:       Tuberculosis test or PPD  Vitamin B6 25 mg TID   AVOID:    Dramamine      *Gardasil  Emetrol       Live Poliovirus  Ginger Root 250 mg QID    MMR (measles, mumps &  High Complex Carbs @ Bedtime    rebella)  Sea Bands-Accupressure    Varicella (Chickenpox)  Unisom 1/2 tab TID     *No known complications           If received before Pain:         Known pregnancy;   Darvocet       Resume series after  Lortab        Delivery  Percocet    Yeast:   Tramadol      Femstat  Tylenol 3      Gyne-lotrimin  Ultram       Monistat  Vicodin           MISC:         All Sunscreens           Hair Coloring/highlights          Insect Repellant's          (Including DEET)         Mystic Tans    https://www.cdc.gov/pregnancy/infections.html">  First Trimester of Pregnancy  The first trimester of pregnancy starts on the first day of your last menstrual period until the end of week 12. This is also called months 1 through 3 of pregnancy. Body changes during your first trimester Your body goes through many changes during pregnancy. The changes usually return to normal after your baby is born. Physical changes  You may gain or lose weight.  Your breasts may grow larger and hurt. The area around your nipples may get darker.  Dark spots or blotches may develop on your face.  You may have changes in your hair. Health changes  You may feel like you might vomit (nauseous), and you may vomit.  You may have heartburn.  You may have headaches.  You may have trouble pooping (constipation).  Your gums may bleed. Other changes  You may get tired easily.  You may pee (urinate) more often.  Your menstrual periods will  stop.  You may not feel hungry.  You may want to eat certain kinds of food.  You may have changes in your emotions from day to day.  You may have more dreams. Follow these instructions at home: Medicines  Take over-the-counter and prescription medicines only as told by your doctor. Some medicines are not safe during pregnancy.  Take a prenatal vitamin that contains at least 600 micrograms (mcg) of folic acid. Eating and drinking  Eat healthy meals that include: ? Fresh fruits and vegetables. ? Whole grains. ? Good sources of protein, such as meat, eggs, or tofu. ? Low-fat dairy products.  Avoid raw meat and unpasteurized juice, milk, and cheese.  If you feel like you may vomit, or you vomit: ? Eat 4 or 5 small meals a day instead of 3 large meals. ? Try eating a few soda crackers. ? Drink liquids between meals instead of during meals.  You may need to take these actions to prevent or treat trouble pooping: ? Drink enough fluids to keep your pee (urine) pale yellow. ?   Eat foods that are high in fiber. These include beans, whole grains, and fresh fruits and vegetables. ? Limit foods that are high in fat and sugar. These include fried or sweet foods. Activity  Exercise only as told by your doctor. Most people can do their usual exercise routine during pregnancy.  Stop exercising if you have cramps or pain in your lower belly (abdomen) or low back.  Do not exercise if it is too hot or too humid, or if you are in a place of great height (high altitude).  Avoid heavy lifting.  If you choose to, you may have sex unless your doctor tells you not to. Relieving pain and discomfort  Wear a good support bra if your breasts are sore.  Rest with your legs raised (elevated) if you have leg cramps or low back pain.  If you have bulging veins (varicose veins) in your legs: ? Wear support hose as told by your doctor. ? Raise your feet for 15 minutes, 3-4 times a day. ? Limit salt  in your food. Safety  Wear your seat belt at all times when you are in a car.  Talk with your doctor if someone is hurting you or yelling at you.  Talk with your doctor if you are feeling sad or have thoughts of hurting yourself. Lifestyle  Do not use hot tubs, steam rooms, or saunas.  Do not douche. Do not use tampons or scented sanitary pads.  Do not use herbal medicines, illegal drugs, or medicines that are not approved by your doctor. Do not drink alcohol.  Do not smoke or use any products that contain nicotine or tobacco. If you need help quitting, ask your doctor.  Avoid cat litter boxes and soil that is used by cats. These carry germs that can cause harm to the baby and can cause a loss of your baby by miscarriage or stillbirth. General instructions  Keep all follow-up visits. This is important.  Ask for help if you need counseling or if you need help with nutrition. Your doctor can give you advice or tell you where to go for help.  Visit your dentist. At home, brush your teeth with a soft toothbrush. Floss gently.  Write down your questions. Take them to your prenatal visits. Where to find more information  American Pregnancy Association: americanpregnancy.org  American College of Obstetricians and Gynecologists: www.acog.org  Office on Women's Health: womenshealth.gov/pregnancy Contact a doctor if:  You are dizzy.  You have a fever.  You have mild cramps or pressure in your lower belly.  You have a nagging pain in your belly area.  You continue to feel like you may vomit, you vomit, or you have watery poop (diarrhea) for 24 hours or longer.  You have a bad-smelling fluid coming from your vagina.  You have pain when you pee.  You are exposed to a disease that spreads from person to person, such as chickenpox, measles, Zika virus, HIV, or hepatitis. Get help right away if:  You have spotting or bleeding from your vagina.  You have very bad belly cramping  or pain.  You have shortness of breath or chest pain.  You have any kind of injury, such as from a fall or a car crash.  You have new or increased pain, swelling, or redness in an arm or leg. Summary  The first trimester of pregnancy starts on the first day of your last menstrual period until the end of week 12 (months 1 through   3).  Eat 4 or 5 small meals a day instead of 3 large meals.  Do not smoke or use any products that contain nicotine or tobacco. If you need help quitting, ask your doctor.  Keep all follow-up visits. This information is not intended to replace advice given to you by your health care provider. Make sure you discuss any questions you have with your health care provider. Document Revised: 03/21/2020 Document Reviewed: 01/26/2020 Elsevier Patient Education  2021 [employer]  

## 2021-03-09 LAB — URINALYSIS, ROUTINE W REFLEX MICROSCOPIC
Bilirubin, UA: NEGATIVE
Glucose, UA: NEGATIVE
Nitrite, UA: NEGATIVE
RBC, UA: NEGATIVE
Specific Gravity, UA: >=1.03 — AB (ref 1.005–1.030)
Urobilinogen, Ur: 1 mg/dL (ref 0.2–1.0)
pH, UA: 6 (ref 5.0–7.5)

## 2021-03-09 LAB — MICROSCOPIC EXAMINATION
Casts: NONE SEEN /lpf
RBC, Urine: NONE SEEN /hpf (ref 0–2)
WBC, UA: >30 /hpf — AB (ref 0–5)

## 2021-03-10 LAB — URINE CULTURE, OB REFLEX

## 2021-03-10 LAB — CULTURE, OB URINE

## 2021-03-11 ENCOUNTER — Ambulatory Visit (INDEPENDENT_AMBULATORY_CARE_PROVIDER_SITE_OTHER): Payer: Self-pay | Admitting: Obstetrics and Gynecology

## 2021-03-11 ENCOUNTER — Encounter: Payer: Self-pay | Admitting: Obstetrics and Gynecology

## 2021-03-11 ENCOUNTER — Other Ambulatory Visit: Payer: Self-pay

## 2021-03-11 ENCOUNTER — Other Ambulatory Visit (HOSPITAL_COMMUNITY)
Admission: RE | Admit: 2021-03-11 | Discharge: 2021-03-11 | Disposition: A | Discharge status: Home / Self Care | Payer: Self-pay | Source: Ambulatory Visit | Attending: Obstetrics and Gynecology | Admitting: Obstetrics and Gynecology

## 2021-03-11 VITALS — BP 125/77 | HR 72 | Wt 161.8 lb

## 2021-03-11 DIAGNOSIS — Z3492 Encounter for supervision of normal pregnancy, unspecified, second trimester: Secondary | ICD-10-CM

## 2021-03-11 DIAGNOSIS — Z124 Encounter for screening for malignant neoplasm of cervix: Secondary | ICD-10-CM | POA: Insufficient documentation

## 2021-03-11 DIAGNOSIS — Z3A14 14 weeks gestation of pregnancy: Secondary | ICD-10-CM

## 2021-03-11 NOTE — Addendum Note (Signed)
Addended by: Dorian Pod on: 03/11/2021 01:53 PM   Modules accepted: Orders

## 2021-03-11 NOTE — Progress Notes (Signed)
NOB: She is feeling better.  Nausea and vomiting resolving.  Because of her macrosomia and previous insulin-dependent diabetes during pregnancy I am attempting to have her crown-rump length ultrasound as soon as possible.  Scheduling her 20-week anatomy ultrasound today.  Patient will begin aspirin daily.  Recommend 1 hour GCT with next visit at 18 weeks.  Pap smear performed today.  Physical examination General NAD, Conversant  HEENT Atraumatic; Op clear with mmm.  Normo-cephalic. Pupils reactive. Anicteric sclerae  Thyroid/Neck Smooth without nodularity or enlargement. Normal ROM.  Neck Supple.  Skin No rashes, lesions or ulceration. Normal palpated skin turgor. No nodularity.  Breasts: No masses or discharge.  Symmetric.  No axillary adenopathy.  Lungs: Clear to auscultation.No rales or wheezes. Normal Respiratory effort, no retractions.  Heart: NSR.  No murmurs or rubs appreciated. No periferal edema  Abdomen: Soft.  Non-tender.  No masses.  No HSM. No hernia  Extremities: Moves all appropriately.  Normal ROM for age. No lymphadenopathy.  Neuro: Oriented to PPT.  Normal mood. Normal affect.     Pelvic:   Vulva: Normal appearance.  No lesions.  Vagina: No lesions or abnormalities noted.  Support: Normal pelvic support.  Urethra No masses tenderness or scarring.  Meatus Normal size without lesions or prolapse.  Cervix: Normal appearance.  No lesions.  Anus: Normal exam.  No lesions.  Perineum: Normal exam.  No lesions.        Bimanual   Adnexae: No masses.  Non-tender to palpation.  Uterus: Enlarged.  Fetal heart tones 172 .  Mobile.  AV.  Adnexae: No masses.  Non-tender to palpation.  Cul-de-sac: Negative for abnormality.  Adnexae: No masses.  Non-tender to palpation.         Pelvimetry   Diagonal: Reached.  Spines: Average.  Sacrum: Concave.  Pubic Arch: Normal.

## 2021-03-12 LAB — HIV ANTIBODY (ROUTINE TESTING W REFLEX): HIV Screen 4th Generation wRfx: NONREACTIVE

## 2021-03-12 LAB — RPR: RPR Ser Ql: NONREACTIVE

## 2021-03-12 LAB — MONITOR DRUG PROFILE 14(MW)
Amphetamine Scrn, Ur: NEGATIVE ng/mL
BARBITURATE SCREEN URINE: NEGATIVE ng/mL
BENZODIAZEPINE SCREEN, URINE: NEGATIVE ng/mL
Buprenorphine, Urine: NEGATIVE ng/mL
CANNABINOIDS UR QL SCN: NEGATIVE ng/mL
Cocaine (Metab) Scrn, Ur: NEGATIVE ng/mL
Creatinine(Crt), U: 298.2 mg/dL (ref 20.0–300.0)
Fentanyl, Urine: NEGATIVE pg/mL
Meperidine Screen, Urine: NEGATIVE ng/mL
Methadone Screen, Urine: NEGATIVE ng/mL
OXYCODONE+OXYMORPHONE UR QL SCN: NEGATIVE ng/mL
Opiate Scrn, Ur: NEGATIVE ng/mL
Ph of Urine: 6.1 (ref 4.5–8.9)
Phencyclidine Qn, Ur: NEGATIVE ng/mL
Propoxyphene Scrn, Ur: NEGATIVE ng/mL
SPECIFIC GRAVITY: 1.023
Tramadol Screen, Urine: NEGATIVE ng/mL

## 2021-03-12 LAB — VARICELLA ZOSTER ANTIBODY, IGG: Varicella zoster IgG: 1889 index (ref >165)

## 2021-03-12 LAB — RUBELLA SCREEN: Rubella Antibodies, IGG: 2.6 index (ref >0.99)

## 2021-03-12 LAB — ANTIBODY SCREEN: Antibody Screen: NEGATIVE

## 2021-03-12 LAB — ABO AND RH: Rh Factor: POSITIVE

## 2021-03-13 LAB — GC/CHLAMYDIA PROBE AMP
Chlamydia trachomatis, NAA: NEGATIVE
Neisseria Gonorrhoeae by PCR: NEGATIVE

## 2021-03-14 LAB — CYTOLOGY - PAP
Comment: NEGATIVE
Diagnosis: NEGATIVE
High risk HPV: NEGATIVE

## 2021-03-15 ENCOUNTER — Ambulatory Visit: Payer: Self-pay

## 2021-03-15 ENCOUNTER — Other Ambulatory Visit: Payer: Self-pay | Admitting: Obstetrics and Gynecology

## 2021-03-15 ENCOUNTER — Other Ambulatory Visit: Payer: Self-pay

## 2021-03-15 ENCOUNTER — Ambulatory Visit
Admission: RE | Admit: 2021-03-15 | Discharge: 2021-03-15 | Disposition: A | Discharge status: Home / Self Care | Payer: Self-pay | Source: Ambulatory Visit | Attending: Obstetrics and Gynecology | Admitting: Obstetrics and Gynecology

## 2021-03-15 DIAGNOSIS — Z3687 Encounter for antenatal screening for uncertain dates: Secondary | ICD-10-CM | POA: Insufficient documentation

## 2021-03-15 DIAGNOSIS — Z3A14 14 weeks gestation of pregnancy: Secondary | ICD-10-CM

## 2021-03-15 DIAGNOSIS — Z3492 Encounter for supervision of normal pregnancy, unspecified, second trimester: Secondary | ICD-10-CM

## 2021-04-08 ENCOUNTER — Other Ambulatory Visit: Payer: Self-pay | Admitting: Surgical

## 2021-04-08 DIAGNOSIS — Z3492 Encounter for supervision of normal pregnancy, unspecified, second trimester: Secondary | ICD-10-CM

## 2021-04-08 NOTE — Patient Instructions (Signed)

## 2021-04-09 ENCOUNTER — Encounter: Payer: Self-pay | Admitting: Obstetrics and Gynecology

## 2021-04-09 ENCOUNTER — Other Ambulatory Visit: Payer: Self-pay

## 2021-04-09 ENCOUNTER — Ambulatory Visit (INDEPENDENT_AMBULATORY_CARE_PROVIDER_SITE_OTHER): Payer: Self-pay | Admitting: Obstetrics and Gynecology

## 2021-04-09 VITALS — BP 114/72 | HR 74 | Wt 161.8 lb

## 2021-04-09 DIAGNOSIS — Z3A14 14 weeks gestation of pregnancy: Secondary | ICD-10-CM

## 2021-04-09 DIAGNOSIS — O09292 Supervision of pregnancy with other poor reproductive or obstetric history, second trimester: Secondary | ICD-10-CM

## 2021-04-09 DIAGNOSIS — Z3482 Encounter for supervision of other normal pregnancy, second trimester: Secondary | ICD-10-CM

## 2021-04-09 DIAGNOSIS — Z1379 Encounter for other screening for genetic and chromosomal anomalies: Secondary | ICD-10-CM

## 2021-04-09 DIAGNOSIS — Z3492 Encounter for supervision of normal pregnancy, unspecified, second trimester: Secondary | ICD-10-CM

## 2021-04-09 DIAGNOSIS — Z8632 Personal history of gestational diabetes: Secondary | ICD-10-CM | POA: Insufficient documentation

## 2021-04-09 DIAGNOSIS — O09293 Supervision of pregnancy with other poor reproductive or obstetric history, third trimester: Secondary | ICD-10-CM | POA: Insufficient documentation

## 2021-04-09 LAB — POCT URINALYSIS DIPSTICK OB
Bilirubin, UA: NEGATIVE
Blood, UA: NEGATIVE
Glucose, UA: NEGATIVE
Ketones, UA: NEGATIVE
Leukocytes, UA: NEGATIVE
Nitrite, UA: NEGATIVE
POC,PROTEIN,UA: NEGATIVE
Spec Grav, UA: 1.025 (ref 1.010–1.025)
Urobilinogen, UA: 0.2 E.U./dL
pH, UA: 6.5 (ref 5.0–8.0)

## 2021-04-09 NOTE — Progress Notes (Signed)
ROB: Patient doing well, no complaints. Completing early glucola today due to h/o insulin-dependent GDM in prior pregnancy.  Is taking baby aspirin. Reviewed dating ultrasound, dates changed based on ultrasound (3 week difference).  RTC in 4 weeks, to complete MaterniT21 today, and AFP next visit.

## 2021-04-09 NOTE — Progress Notes (Signed)
OB-Pt present for routine prenatal care. Pt stated that she was doing well.  

## 2021-04-10 LAB — GLUCOSE, 1 HOUR GESTATIONAL: Gestational Diabetes Screen: 129 mg/dL (ref 65–139)

## 2021-04-17 ENCOUNTER — Other Ambulatory Visit: Payer: Self-pay

## 2021-05-07 ENCOUNTER — Other Ambulatory Visit: Payer: Self-pay

## 2021-05-07 ENCOUNTER — Encounter: Payer: Self-pay | Admitting: Obstetrics and Gynecology

## 2021-05-07 ENCOUNTER — Ambulatory Visit (INDEPENDENT_AMBULATORY_CARE_PROVIDER_SITE_OTHER): Payer: Self-pay | Admitting: Obstetrics and Gynecology

## 2021-05-07 VITALS — BP 121/68 | HR 112 | Wt 162.7 lb

## 2021-05-07 DIAGNOSIS — Z3A18 18 weeks gestation of pregnancy: Secondary | ICD-10-CM

## 2021-05-07 DIAGNOSIS — Z3482 Encounter for supervision of other normal pregnancy, second trimester: Secondary | ICD-10-CM

## 2021-05-07 LAB — POCT URINALYSIS DIPSTICK OB
Bilirubin, UA: NEGATIVE
Glucose, UA: NEGATIVE
Ketones, UA: NEGATIVE
Leukocytes, UA: NEGATIVE
Nitrite, UA: NEGATIVE
Spec Grav, UA: 1.01 (ref 1.010–1.025)
Urobilinogen, UA: 0.2 E.U./dL
pH, UA: 6.5 (ref 5.0–8.0)

## 2021-05-07 NOTE — Progress Notes (Signed)
ROB: No complaints.  Patient denies urinary symptoms.  Ultrasound scheduled for next week.  Has completed 1 hour GCT- recommend again at 26 weeks.  aFP ordered today MaterniT 21 today.

## 2021-05-09 LAB — AFP, SERUM, OPEN SPINA BIFIDA
AFP MoM: 1.12
AFP Value: 48.9 ng/mL
Gest. Age on Collection Date: 18 weeks
Maternal Age At EDD: 31.7 yr
OSBR Risk 1 IN: 8371
Test Results:: NEGATIVE
Weight: 161 [lb_av]

## 2021-05-11 LAB — MATERNIT21  PLUS CORE+ESS+SCA, BLOOD
11q23 deletion (Jacobsen): NOT DETECTED
15q11 deletion (PW Angelman): NOT DETECTED
1p36 deletion syndrome: NOT DETECTED
22q11 deletion (DiGeorge): NOT DETECTED
4p16 deletion(Wolf-Hirschhorn): NOT DETECTED
5p15 deletion (Cri-du-chat): NOT DETECTED
8q24 deletion (Langer-Giedion): NOT DETECTED
Fetal Fraction: 11
Monosomy X (Turner Syndrome): NOT DETECTED
Result (T21): NEGATIVE
Trisomy 13 (Patau syndrome): NEGATIVE
Trisomy 16: NOT DETECTED
Trisomy 18 (Edwards syndrome): NEGATIVE
Trisomy 21 (Down syndrome): NEGATIVE
Trisomy 22: NOT DETECTED
XXX (Triple X Syndrome): NOT DETECTED
XXY (Klinefelter Syndrome): NOT DETECTED
XYY (Jacobs Syndrome): NOT DETECTED

## 2021-05-14 ENCOUNTER — Ambulatory Visit
Admission: RE | Admit: 2021-05-14 | Discharge: 2021-05-14 | Disposition: A | Discharge status: Home / Self Care | Payer: Self-pay | Source: Ambulatory Visit | Attending: Obstetrics and Gynecology | Admitting: Obstetrics and Gynecology

## 2021-05-14 ENCOUNTER — Other Ambulatory Visit: Payer: Self-pay

## 2021-05-14 DIAGNOSIS — Z3482 Encounter for supervision of other normal pregnancy, second trimester: Secondary | ICD-10-CM | POA: Insufficient documentation

## 2021-06-12 ENCOUNTER — Encounter: Payer: Self-pay | Admitting: Obstetrics and Gynecology

## 2021-06-12 ENCOUNTER — Other Ambulatory Visit: Payer: Self-pay

## 2021-06-12 ENCOUNTER — Ambulatory Visit (INDEPENDENT_AMBULATORY_CARE_PROVIDER_SITE_OTHER): Payer: Self-pay | Admitting: Obstetrics and Gynecology

## 2021-06-12 VITALS — BP 127/82 | HR 116 | Wt 166.4 lb

## 2021-06-12 DIAGNOSIS — O09292 Supervision of pregnancy with other poor reproductive or obstetric history, second trimester: Secondary | ICD-10-CM

## 2021-06-12 DIAGNOSIS — Z3482 Encounter for supervision of other normal pregnancy, second trimester: Secondary | ICD-10-CM

## 2021-06-12 DIAGNOSIS — Z8632 Personal history of gestational diabetes: Secondary | ICD-10-CM

## 2021-06-12 DIAGNOSIS — Z3A23 23 weeks gestation of pregnancy: Secondary | ICD-10-CM

## 2021-06-12 LAB — POCT URINALYSIS DIPSTICK OB
Bilirubin, UA: NEGATIVE
Blood, UA: NEGATIVE
Glucose, UA: NEGATIVE
Nitrite, UA: NEGATIVE
Spec Grav, UA: 1.015 (ref 1.010–1.025)
Urobilinogen, UA: 0.2 E.U./dL
pH, UA: 6.5 (ref 5.0–8.0)

## 2021-06-12 NOTE — Patient Instructions (Signed)
 1-Hour Glucose  No dessert the night before No sweet drinks the day of- soda, fruit juice, sweet tea No sweet breakfast- pancakes, donuts May have mostly protein- egg, bacon, wheat toast, black coffee.               Grilled chicken, salad, vegetable, water.       3-Hour Glucose Test  Must be fasting.  Nothing to eat or drink after midnight.  May have morning medication with a sip of water.    WHAT OB PATIENTS CAN EXPECT  Confirmation of pregnancy and ultrasound ordered if medically indicated-[redacted] weeks gestation New OB (NOB) intake with nurse and New OB (NOB) labs- [redacted] weeks gestation New OB (NOB) physical examination with provider- 11/[redacted] weeks gestation Flu vaccine-[redacted] weeks gestation Anatomy scan-[redacted] weeks gestation Glucose tolerance test, blood work to test for anemia, T-dap vaccine-[redacted] weeks gestation Vaginal swabs/cultures-STD/Group B strep-[redacted] weeks gestation Appointments every 4 weeks until 28 weeks Every 2 weeks from 28 weeks until 36 weeks Weekly visits from 36 weeks until delivery    Common Medications Safe in Pregnancy  Acne:      Constipation:  Benzoyl Peroxide     Colace  Clindamycin      Dulcolax Suppository  Topica Erythromycin     Fibercon  Salicylic Acid      Metamucil         Miralax AVOID:        Senakot   Accutane    Cough:  Retin-A       Cough Drops  Tetracycline      Phenergan w/ Codeine if Rx  Minocycline      Robitussin (Plain & DM)  Antibiotics:     Crabs/Lice:  Ceclor       RID  Cephalosporins    AVOID:  E-Mycins      Kwell  Keflex  Macrobid/Macrodantin   Diarrhea:  Penicillin      Kao-Pectate  Zithromax      Imodium AD         PUSH FLUIDS AVOID:       Cipro     Fever:  Tetracycline      Tylenol (Regular or Extra  Minocycline       Strength)  Levaquin      Extra Strength-Do not          Exceed 8 tabs/24 hrs Caffeine:        <200mg/day (equiv. To 1 cup of coffee or  approx. 3 12 oz  sodas)         Gas: Cold/Hayfever:       Gas-X  Benadryl      Mylicon  Claritin       Phazyme  **Claritin-D        Chlor-Trimeton    Headaches:  Dimetapp      ASA-Free Excedrin  Drixoral-Non-Drowsy     Cold Compress  Mucinex (Guaifenasin)     Tylenol (Regular or Extra  Sudafed/Sudafed-12 Hour     Strength)  **Sudafed PE Pseudoephedrine   Tylenol Cold & Sinus     Vicks Vapor Rub  Zyrtec  **AVOID if Problems With Blood Pressure         Heartburn: Avoid lying down for at least 1 hour after meals  Aciphex      Maalox     Rash:  Milk of Magnesia     Benadryl    Mylanta       1% Hydrocortisone Cream  Pepcid  Pepcid Complete     Sleep Aids:  Prevacid      Ambien   Prilosec       Benadryl  Rolaids       Chamomile Tea  Tums (Limit 4/day)     Unisom         Tylenol PM         Warm milk-add vanilla or  Hemorrhoids:       Sugar for taste  Anusol/Anusol H.C.  (RX: Analapram 2.5%)  Sugar Substitutes:  Hydrocortisone OTC     Ok in moderation  Preparation H      Tucks        Vaseline lotion applied to tissue with wiping    Herpes:     Throat:  Acyclovir      Oragel  Famvir  Valtrex     Vaccines:         Flu Shot Leg Cramps:       *Gardasil  Benadryl      Hepatitis A         Hepatitis B Nasal Spray:       Pneumovax  Saline Nasal Spray     Polio Booster         Tetanus Nausea:       Tuberculosis test or PPD  Vitamin B6 25 mg TID   AVOID:    Dramamine      *Gardasil  Emetrol       Live Poliovirus  Ginger Root 250 mg QID    MMR (measles, mumps &  High Complex Carbs @ Bedtime    rebella)  Sea Bands-Accupressure    Varicella (Chickenpox)  Unisom 1/2 tab TID     *No known complications           If received before Pain:         Known pregnancy;   Darvocet       Resume series after  Lortab        Delivery  Percocet    Yeast:   Tramadol      Femstat  Tylenol 3      Gyne-lotrimin  Ultram       Monistat  Vicodin           MISC:         All Sunscreens           Hair  Coloring/highlights          Insect Repellant's          (Including DEET)         Mystic Tans  

## 2021-06-12 NOTE — Progress Notes (Signed)
   OB-Pt present for routine prenatal care. Pt stated fetal movement present; no contractions present; no vaginal bleeding and no changes in vaginal discharge.  Pt has not complains.

## 2021-06-12 NOTE — Progress Notes (Signed)
ROB:  Doing well, no major issues.   Advised on breastfeeding. For 28 week labs next visit. RTC in 4 weeks.

## 2021-07-08 NOTE — Patient Instructions (Signed)
Breastfeeding and Breast Care It is normal to have some problems when you start to breastfeed your new baby. But there are things that you can do to take care of yourself and help prevent problems. This includes keeping your breasts healthy and making sure that your baby's mouth attaches (latches) properly to your nipple for feedings. Work with your doctor or breastfeeding specialist to find what works best for you. How does self-care benefit me? If you keep your breasts healthy and you let your baby attach to your nipples in the right way, you will avoid these problems: Cracked or sore nipples. Breasts becoming overfilled with milk. Plugged milk ducts. Low milk supply. Breast swelling or infection. How does self-care benefit my baby? By preventing problems with your breasts, you will ensure that your baby will feed well and will gain the right amount of weight. What actions can I take to care for myself during breastfeeding? Best ways to breastfeed Always make sure that your baby latches properly to breastfeed. Make sure that your baby is in a proper position. Try different breastfeeding positions to find one that works best for you and your baby. Breastfeed when you feel like you need to make your breasts less full or when your baby shows signs of hunger. This is called "breastfeeding on demand." Do not delay feedings. Try to relax when it is time to feed your baby. This helps your body release milk from your breast. To help increase milk flow, do these things before feeding: Remove a small amount of milk from your breast. Use a pump or squeeze with your hand. Apply warm, moist heat to your breast. Do this in the shower or use hand towels soaked with warm water. Massage your breasts. Do this when you are breastfeeding as well. Caring for your breasts   To help your breasts stay healthy and keep them from getting too dry: Avoid using soap on your nipples. Let your nipples air-dry for 3-4  minutes after each feeding. Do not use things like a hair dryer to dry your breasts. This can make the skin dry and will cause irritation and pain. Use only cotton bra pads to soak up breast milk that leaks. Change the pads if they become soaked with milk. If you use bra pads that can be thrown away, change them often. Put some lanolin on your nipples after breastfeeding. Pure lanolin does not need to be washed off your nipple before you feed your baby again. Pure lanolin is not harmful to your baby. Rub some breast milk into your nipples: Use your hand to squeeze out a few drops of breast milk. Gently massage the milk into your nipples. Let your nipples air-dry. Wear a supportive nursing bra. Avoid wearing: Tight clothing. Underwire bras or bras that put pressure on your breasts. Use ice to help relieve pain or swelling of your breasts: Put ice in a plastic bag. Place a towel between your skin and the bag. Leave the ice on for 20 minutes, 2-3 times a day. Follow these instructions at home: Drink enough fluid to keep your pee (urine) pale yellow. Get plenty of rest. Sleep when your baby sleeps. Talk to your doctor or breastfeeding specialist before taking any herbal supplements. Eat a balanced diet. This includes fruits, vegetables, whole grains, lean proteins, and dairy or dairy alternatives Contact a health care provider if: You have nipple pain. You have cracking or soreness in your nipples that lasts longer than 1 week. Your breasts are  overfilled with milk, and this lasts longer than 48 hours. You have a fever. You have pus-like fluid coming from your nipple. You have redness, a rash, swelling, itching, or burning on your breast. Your baby does not gain weight. Your baby loses weight. Your baby is not feeding regularly or is very sleepy and lacks energy. Summary There are things that you can do to take care of yourself and help prevent many common breastfeeding problems. Always  make sure that your baby's mouth attaches (latches) to your nipple properly to breastfeed. Keep your nipples from getting too dry, drink plenty of fluid, and get plenty of rest. Feed on demand. Do not delay feedings. This information is not intended to replace advice given to you by your health care provider. Make sure you discuss any questions you have with your health care provider. Document Revised: 04/03/2020 Document Reviewed: 04/03/2020 Elsevier Patient Education  [place].    Common Medications Safe in Pregnancy  Acne:      Constipation:  Benzoyl Peroxide     Colace  Clindamycin      Dulcolax Suppository  Topica Erythromycin     Fibercon  Salicylic Acid      Metamucil         Miralax AVOID:        Senakot   Accutane    Cough:  Retin-A       Cough Drops  Tetracycline      Phenergan w/ Codeine if Rx  Minocycline      Robitussin (Plain & DM)  Antibiotics:     Crabs/Lice:  Ceclor       RID  Cephalosporins    AVOID:  E-Mycins      Kwell  Keflex  Macrobid/Macrodantin   Diarrhea:  Penicillin      Kao-Pectate  Zithromax      Imodium AD         PUSH FLUIDS AVOID:       Cipro     Fever:  Tetracycline      Tylenol (Regular or Extra  Minocycline       Strength)  Levaquin      Extra Strength-Do not          Exceed 8 tabs/24 hrs Caffeine:        <258m/day (equiv. To 1 cup of coffee or  approx. 3 12 oz sodas)         Gas: Cold/Hayfever:       Gas-X  Benadryl      Mylicon  Claritin       Phazyme  **Claritin-D        Chlor-Trimeton    Headaches:  Dimetapp      ASA-Free Excedrin  Drixoral-Non-Drowsy     Cold Compress  Mucinex (Guaifenasin)     Tylenol (Regular or Extra  Sudafed/Sudafed-12 Hour     Strength)  **Sudafed PE Pseudoephedrine   Tylenol Cold & Sinus     Vicks Vapor Rub  Zyrtec  **AVOID if Problems With Blood Pressure         Heartburn: Avoid lying down for at least 1 hour after meals  Aciphex      Maalox     Rash:  Milk of  Magnesia     Benadryl    Mylanta       1% Hydrocortisone Cream  Pepcid  Pepcid Complete   Sleep Aids:  Prevacid      Ambien   Prilosec       Benadryl  Rolaids       Chamomile Tea  Tums (Limit 4/day)     Unisom         Tylenol PM         Warm milk-add vanilla or  Hemorrhoids:       Sugar for taste  Anusol/Anusol H.C.  (RX: Analapram 2.5%)  Sugar Substitutes:  Hydrocortisone OTC     Ok in moderation  Preparation H      Tucks        Vaseline lotion applied to tissue with wiping    Herpes:     Throat:  Acyclovir      Oragel  Famvir  Valtrex     Vaccines:         Flu Shot Leg Cramps:       *Gardasil  Benadryl      Hepatitis A         Hepatitis B Nasal Spray:       Pneumovax  Saline Nasal Spray     Polio Booster         Tetanus Nausea:       Tuberculosis test or PPD  Vitamin B6 25 mg TID   AVOID:    Dramamine      *Gardasil  Emetrol       Live Poliovirus  Ginger Root 250 mg QID    MMR (measles, mumps &  High Complex Carbs @ Bedtime    rebella)  Sea Bands-Accupressure    Varicella (Chickenpox)  Unisom 1/2 tab TID     *No known complications           If received before Pain:         Known pregnancy;   Darvocet       Resume series after  Lortab        Delivery  Percocet    Yeast:   Tramadol      Femstat  Tylenol 3      Gyne-lotrimin  Ultram       Monistat  Vicodin           MISC:         All Sunscreens           Hair Coloring/highlights          Insect Repellant's          (Including DEET)         Mystic Tans    Influenza (Flu) Vaccine (Inactivated or Recombinant): What You Need to Know 1. Why get vaccinated? Influenza vaccine can prevent influenza (flu). Flu is a contagious disease that spreads around the [place] every year, usually between October and May. Anyone can get the flu, but it is more dangerous for some people. Infants and young children, people 22 years and older, pregnant people, and people with certain health conditions or a weakened immune  system are at greatest risk of flu complications. Pneumonia, bronchitis, sinus infections, and ear infections are examples of flu-related complications. If you have a medical condition, such as heart disease, cancer, or diabetes, flu can make it worse. Flu can cause fever and chills, sore throat, muscle aches, fatigue, cough, headache, and runny or stuffy nose. Some people may have vomiting and diarrhea, though this is more common in children than adults. In an average year, thousands of people in the [place] die from flu, and many more are hospitalized. Flu vaccine prevents millions of illnesses and flu-related visits to the doctor each year. 2. Influenza vaccines  CDC recommends everyone 6 months and older get vaccinated every flu season. Children 6 months through 42 years of age may need 2 doses during a single flu season. Everyone else needs only 1 dose each flu season. It takes about 2 weeks for protection to develop after vaccination. There are many flu viruses, and they are always changing. Each year a new flu vaccine is made to protect against the influenza viruses believed to be likely to cause disease in the upcoming flu season. Even when the vaccine doesn't exactly match these viruses, it may still provide some protection. Influenza vaccine does not cause flu. Influenza vaccine may be given at the same time as other vaccines. 3. Talk with your health care provider Tell your vaccination provider if the person getting the vaccine: Has had an allergic reaction after a previous dose of influenza vaccine, or has any severe, life-threatening allergies Has ever had Guillain-Barr Syndrome (also called "GBS") In some cases, your health care provider may decide to postpone influenza vaccination until a future visit. Influenza vaccine can be administered at any time during pregnancy. People who are or will be pregnant during influenza season should receive inactivated influenza vaccine. People  with minor illnesses, such as a cold, may be vaccinated. People who are moderately or severely ill should usually wait until they recover before getting influenza vaccine. Your health care provider can give you more information. 4. Risks of a vaccine reaction Soreness, redness, and swelling where the shot is given, fever, muscle aches, and headache can happen after influenza vaccination. There may be a very small increased risk of Guillain-Barr Syndrome (GBS) after inactivated influenza vaccine (the flu shot). Young children who get the flu shot along with pneumococcal vaccine (PCV13) and/or DTaP vaccine at the same time might be slightly more likely to have a seizure caused by fever. Tell your health care provider if a child who is getting flu vaccine has ever had a seizure. People sometimes faint after medical procedures, including vaccination. Tell your provider if you feel dizzy or have vision changes or ringing in the ears. As with any medicine, there is a very remote chance of a vaccine causing a severe allergic reaction, other serious injury, or death. 5. What if there is a serious problem? An allergic reaction could occur after the vaccinated person leaves the clinic. If you see signs of a severe allergic reaction (hives, swelling of the face and throat, difficulty breathing, a fast heartbeat, dizziness, or weakness), call 9-1-1 and get the person to the nearest hospital. For other signs that concern you, call your health care provider. Adverse reactions should be reported to the Vaccine Adverse Event Reporting System (VAERS). Your health care provider will usually file this report, or you can do it yourself. Visit the VAERS website at www.vaers.SamedayNews.es or call 770-725-7931. VAERS is only for reporting reactions, and VAERS staff members do not give medical advice. 6. The National Vaccine Injury Compensation Program The [employer] Vaccine Injury Compensation Program (VICP) is a federal program  that was created to compensate people who may have been injured by certain vaccines. Claims regarding alleged injury or death due to vaccination have a time limit for filing, which may be as short as two years. Visit the VICP website at GoldCloset.com.ee or call (859)567-3339 to learn about the program and about filing a claim. 7. How can I learn more? Ask your health care provider. Call your local or state health department. Visit the website of the Food and Drug Administration (  FDA) for vaccine package inserts and additional information at TraderRating.uy. Contact the Centers for Disease Control and Prevention (CDC): Call (670)224-0907 (1-800-CDC-INFO) or Visit CDC's website at https://gibson.com/. Vaccine Information Statement Inactivated Influenza Vaccine (06/01/2020) This information is not intended to replace advice given to you by your health care provider. Make sure you discuss any questions you have with your health care provider. Document Revised: 07/19/2020 Document Reviewed: 07/19/2020 Elsevier Patient Education  [place].   Tdap (Tetanus, Diphtheria, Pertussis) Vaccine: What You Need to Know 1. Why get vaccinated? Tdap vaccine can prevent tetanus, diphtheria, and pertussis. Diphtheria and pertussis spread from person to person. Tetanus enters the body through cuts or wounds. TETANUS (T) causes painful stiffening of the muscles. Tetanus can lead to serious health problems, including being unable to open the mouth, having trouble swallowing and breathing, or death. DIPHTHERIA (D) can lead to difficulty breathing, heart failure, paralysis, or death. PERTUSSIS (aP), also known as "whooping cough," can cause uncontrollable, violent coughing that makes it hard to breathe, eat, or drink. Pertussis can be extremely serious especially in babies and young children, causing pneumonia, convulsions, brain damage, or death. In teens and adults, it  can cause weight loss, loss of bladder control, passing out, and rib fractures from severe coughing. 2. Tdap vaccine Tdap is only for children 7 years and older, adolescents, and adults.  Adolescents should receive a single dose of Tdap, preferably at age 61 or 53 years. Pregnant people should get a dose of Tdap during every pregnancy, preferably during the early part of the third trimester, to help protect the newborn from pertussis. Infants are most at risk for severe, life-threatening complications from pertussis. Adults who have never received Tdap should get a dose of Tdap. Also, adults should receive a booster dose of either Tdap or Td (a different vaccine that protects against tetanus and diphtheria but not pertussis) every 10 years, or after 5 years in the case of a severe or dirty wound or burn. Tdap may be given at the same time as other vaccines. 3. Talk with your health care provider Tell your vaccine provider if the person getting the vaccine: Has had an allergic reaction after a previous dose of any vaccine that protects against tetanus, diphtheria, or pertussis, or has any severe, life-threatening allergies Has had a coma, decreased level of consciousness, or prolonged seizures within 7 days after a previous dose of any pertussis vaccine (DTP, DTaP, or Tdap) Has seizures or another nervous system problem Has ever had Guillain-Barr Syndrome (also called "GBS") Has had severe pain or swelling after a previous dose of any vaccine that protects against tetanus or diphtheria In some cases, your health care provider may decide to postpone Tdap vaccination until a future visit. People with minor illnesses, such as a cold, may be vaccinated. People who are moderately or severely ill should usually wait until they recover before getting Tdap vaccine.  Your health care provider can give you more information. 4. Risks of a vaccine reaction Pain, redness, or swelling where the shot was given,  mild fever, headache, feeling tired, and nausea, vomiting, diarrhea, or stomachache sometimes happen after Tdap vaccination. People sometimes faint after medical procedures, including vaccination. Tell your provider if you feel dizzy or have vision changes or ringing in the ears.  As with any medicine, there is a very remote chance of a vaccine causing a severe allergic reaction, other serious injury, or death. 5. What if there is a serious problem? An allergic  reaction could occur after the vaccinated person leaves the clinic. If you see signs of a severe allergic reaction (hives, swelling of the face and throat, difficulty breathing, a fast heartbeat, dizziness, or weakness), call 9-1-1 and get the person to the nearest hospital. For other signs that concern you, call your health care provider.  Adverse reactions should be reported to the Vaccine Adverse Event Reporting System (VAERS). Your health care provider will usually file this report, or you can do it yourself. Visit the VAERS website at www.vaers.SamedayNews.es or call 316-650-3025. VAERS is only for reporting reactions, and VAERS staff members do not give medical advice. 6. The National Vaccine Injury Compensation Program The [employer] Vaccine Injury Compensation Program (VICP) is a federal program that was created to compensate people who may have been injured by certain vaccines. Claims regarding alleged injury or death due to vaccination have a time limit for filing, which may be as short as two years. Visit the VICP website at GoldCloset.com.ee or call 712-035-6505 to learn about the program and about filing a claim. 7. How can I learn more? Ask your health care provider. Call your local or state health department. Visit the website of the Food and Drug Administration (FDA) for vaccine package inserts and additional information at TraderRating.uy. Contact the Centers for Disease Control and  Prevention (CDC): Call 438-246-3670 (1-800-CDC-INFO) or Visit CDC's website at http://hunter.com/. Vaccine Information Statement Tdap (Tetanus, Diphtheria, Pertussis) Vaccine (06/01/2020) This information is not intended to replace advice given to you by your health care provider. Make sure you discuss any questions you have with your health care provider. Document Revised: 06/27/2020 Document Reviewed: 06/27/2020 Elsevier Patient Education  2022 [employer].

## 2021-07-09 ENCOUNTER — Other Ambulatory Visit: Payer: Self-pay

## 2021-07-09 ENCOUNTER — Ambulatory Visit (INDEPENDENT_AMBULATORY_CARE_PROVIDER_SITE_OTHER): Payer: Self-pay | Admitting: Obstetrics and Gynecology

## 2021-07-09 ENCOUNTER — Encounter: Payer: Self-pay | Admitting: Obstetrics and Gynecology

## 2021-07-09 VITALS — BP 123/79 | HR 99 | Wt 169.6 lb

## 2021-07-09 DIAGNOSIS — Z23 Encounter for immunization: Secondary | ICD-10-CM

## 2021-07-09 DIAGNOSIS — Z3482 Encounter for supervision of other normal pregnancy, second trimester: Secondary | ICD-10-CM

## 2021-07-09 DIAGNOSIS — Z3A27 27 weeks gestation of pregnancy: Secondary | ICD-10-CM

## 2021-07-09 LAB — POCT URINALYSIS DIPSTICK OB
Bilirubin, UA: NEGATIVE
Blood, UA: NEGATIVE
Glucose, UA: NEGATIVE
Ketones, UA: NEGATIVE
Leukocytes, UA: NEGATIVE
Nitrite, UA: NEGATIVE
Spec Grav, UA: 1.02 (ref 1.010–1.025)
Urobilinogen, UA: 0.2 E.U./dL
pH, UA: 6.5 (ref 5.0–8.0)

## 2021-07-09 MED ORDER — TETANUS-DIPHTH-ACELL PERTUSSIS 5-2.5-18.5 LF-MCG/0.5 IM SUSY
0.5000 mL | PREFILLED_SYRINGE | Freq: Once | INTRAMUSCULAR | Status: AC
  Administered 2021-07-09: 0.5 mL via INTRAMUSCULAR

## 2021-07-09 NOTE — Progress Notes (Signed)
ROB: No complaints.  Small amount of swelling.  Taking aspirin as directed.  Reports very active baby.  Doing 1 hour GCT today.

## 2021-07-09 NOTE — Progress Notes (Signed)
   OB-Pt present for routine prenatal care. Pt stated fetal movement present; no contractions present; no vaginal bleeding and no changes in vaginal discharge.     Pt c/o abd pressure and pain and hands swelling.

## 2021-07-10 LAB — CBC
Hematocrit: 33.1 % — ABNORMAL LOW (ref 34.0–46.6)
Hemoglobin: 11.5 g/dL (ref 11.1–15.9)
MCH: 30.7 pg (ref 26.6–33.0)
MCHC: 34.7 g/dL (ref 31.5–35.7)
MCV: 88 fL (ref 79–97)
Platelets: 159 10*3/uL (ref 150–450)
RBC: 3.75 x10E6/uL — ABNORMAL LOW (ref 3.77–5.28)
RDW: 12 % (ref 11.7–15.4)
WBC: 6.4 10*3/uL (ref 3.4–10.8)

## 2021-07-10 LAB — GLUCOSE, 1 HOUR GESTATIONAL: Gestational Diabetes Screen: 179 mg/dL — ABNORMAL HIGH (ref 65–139)

## 2021-07-10 LAB — HEPATITIS C ANTIBODY: Hep C Virus Ab: <0.1 s/co ratio (ref 0.0–0.9)

## 2021-07-10 LAB — RPR: RPR Ser Ql: NONREACTIVE

## 2021-08-06 ENCOUNTER — Other Ambulatory Visit: Payer: Self-pay

## 2021-08-06 ENCOUNTER — Ambulatory Visit (INDEPENDENT_AMBULATORY_CARE_PROVIDER_SITE_OTHER): Payer: Self-pay | Admitting: Obstetrics and Gynecology

## 2021-08-06 ENCOUNTER — Encounter: Payer: Self-pay | Admitting: Obstetrics and Gynecology

## 2021-08-06 VITALS — BP 128/89 | HR 123 | Wt 167.8 lb

## 2021-08-06 DIAGNOSIS — Z3483 Encounter for supervision of other normal pregnancy, third trimester: Secondary | ICD-10-CM

## 2021-08-06 DIAGNOSIS — O09293 Supervision of pregnancy with other poor reproductive or obstetric history, third trimester: Secondary | ICD-10-CM

## 2021-08-06 DIAGNOSIS — Z8632 Personal history of gestational diabetes: Secondary | ICD-10-CM

## 2021-08-06 DIAGNOSIS — O24419 Gestational diabetes mellitus in pregnancy, unspecified control: Secondary | ICD-10-CM

## 2021-08-06 LAB — POCT URINALYSIS DIPSTICK OB
Bilirubin, UA: NEGATIVE
Glucose, UA: NEGATIVE
Ketones, UA: NEGATIVE
Nitrite, UA: NEGATIVE
Odor: NEGATIVE
POC,PROTEIN,UA: NEGATIVE
Spec Grav, UA: 1.015 (ref 1.010–1.025)
Urobilinogen, UA: 0.2 E.U./dL
pH, UA: 6.5 (ref 5.0–8.0)

## 2021-08-06 MED ORDER — GLYBURIDE 2.5 MG PO TABS: 2.5000 mg | ORAL_TABLET | Freq: Two times a day (BID) | ORAL | 3 refills | Status: AC

## 2021-08-06 NOTE — Progress Notes (Signed)
ROB- Declines flu vaccine. BS log.

## 2021-08-06 NOTE — Progress Notes (Signed)
ROB: Patient doing well, no complaints. Has been checking blood sugars, all fastings >90, normal postprandial lunch, dinners over half elevated (ranging 130s-150s). Will initiate on Glyburide 2.5 mg BID. Given hypoglycemia precautions. Will need growth scan around 36 weeks, and to begin antenatal testing at 34 weeks  Declines flu vaccine today, may consider next visit.

## 2021-08-21 ENCOUNTER — Encounter: Payer: Self-pay | Admitting: Obstetrics and Gynecology

## 2021-08-21 ENCOUNTER — Other Ambulatory Visit: Payer: Self-pay

## 2021-08-21 ENCOUNTER — Ambulatory Visit (INDEPENDENT_AMBULATORY_CARE_PROVIDER_SITE_OTHER): Payer: Self-pay | Admitting: Obstetrics and Gynecology

## 2021-08-21 VITALS — Wt 168.7 lb

## 2021-08-21 DIAGNOSIS — Z3483 Encounter for supervision of other normal pregnancy, third trimester: Secondary | ICD-10-CM

## 2021-08-21 DIAGNOSIS — O24419 Gestational diabetes mellitus in pregnancy, unspecified control: Secondary | ICD-10-CM

## 2021-08-21 DIAGNOSIS — Z3A33 33 weeks gestation of pregnancy: Secondary | ICD-10-CM

## 2021-08-21 LAB — POCT URINALYSIS DIPSTICK OB
Bilirubin, UA: NEGATIVE
Blood, UA: NEGATIVE
Glucose, UA: NEGATIVE
Ketones, UA: NEGATIVE
Nitrite, UA: NEGATIVE
Spec Grav, UA: 1.025 (ref 1.010–1.025)
Urobilinogen, UA: 0.2 E.U./dL
pH, UA: 6 (ref 5.0–8.0)

## 2021-08-21 NOTE — Progress Notes (Signed)
OB-Pt present for routine prenatal care. Pt stated that she was doing well no problems.  

## 2021-08-21 NOTE — Progress Notes (Signed)
ROB: Patient doing well.  Has some pelvic pressure but no regular contractions.  Keeping her sugars in a journal.  Has decreased glyburide to 1.25 twice daily and sugars appear well controlled.  We will continue at this dose.  Ultrasound next week for growth as patient has a history of 8 pound 10 ounce baby.  Begin NSTs next week.  Continue sugar log- consider twice daily sugars if still controlled.

## 2021-08-27 ENCOUNTER — Other Ambulatory Visit: Payer: Self-pay | Admitting: Obstetrics and Gynecology

## 2021-08-27 ENCOUNTER — Ambulatory Visit (INDEPENDENT_AMBULATORY_CARE_PROVIDER_SITE_OTHER): Payer: Self-pay

## 2021-08-27 ENCOUNTER — Other Ambulatory Visit: Payer: Self-pay

## 2021-08-27 DIAGNOSIS — Z3200 Encounter for pregnancy test, result unknown: Secondary | ICD-10-CM

## 2021-08-27 DIAGNOSIS — Z7689 Persons encountering health services in other specified circumstances: Secondary | ICD-10-CM

## 2021-08-30 ENCOUNTER — Encounter: Payer: Self-pay | Admitting: Obstetrics and Gynecology

## 2021-08-30 ENCOUNTER — Other Ambulatory Visit: Payer: Self-pay

## 2021-08-30 ENCOUNTER — Ambulatory Visit (INDEPENDENT_AMBULATORY_CARE_PROVIDER_SITE_OTHER): Payer: Self-pay | Admitting: Obstetrics and Gynecology

## 2021-08-30 VITALS — BP 113/74 | HR 98 | Wt 168.1 lb

## 2021-08-30 DIAGNOSIS — Z3A35 35 weeks gestation of pregnancy: Secondary | ICD-10-CM

## 2021-08-30 DIAGNOSIS — O24419 Gestational diabetes mellitus in pregnancy, unspecified control: Secondary | ICD-10-CM

## 2021-08-30 DIAGNOSIS — Z3483 Encounter for supervision of other normal pregnancy, third trimester: Secondary | ICD-10-CM

## 2021-08-30 NOTE — Progress Notes (Signed)
ROB: No complaints.  She does state that her fasting blood sugars have increased as have her daily blood sugars. (She did not bring her sugar log).  Have encouraged her to begin 5 mg glyburide in the p.m. and two-point 5 in the AM.  If her daily sugars remain high increase to 5 and 5.  Patient verbalized her understanding of this change.  I have also encouraged her to bring her sugar log with her to every visit.  NST weekly.  Cultures next week.  Ultrasound reviewed-52nd percentile.

## 2021-08-30 NOTE — Progress Notes (Signed)
ROB: She is doing well today, no concerns. NST done today.

## 2021-09-02 ENCOUNTER — Observation Stay
Admission: EM | Admit: 2021-09-02 | Discharge: 2021-09-02 | Disposition: A | Discharge status: Home / Self Care | Payer: Medicaid Other | Attending: Obstetrics and Gynecology | Admitting: Obstetrics and Gynecology

## 2021-09-02 DIAGNOSIS — M549 Dorsalgia, unspecified: Secondary | ICD-10-CM | POA: Insufficient documentation

## 2021-09-02 DIAGNOSIS — O471 False labor at or after 37 completed weeks of gestation: Secondary | ICD-10-CM | POA: Diagnosis present

## 2021-09-02 DIAGNOSIS — R109 Unspecified abdominal pain: Secondary | ICD-10-CM

## 2021-09-02 DIAGNOSIS — O26893 Other specified pregnancy related conditions, third trimester: Secondary | ICD-10-CM | POA: Diagnosis not present

## 2021-09-02 DIAGNOSIS — Z3A35 35 weeks gestation of pregnancy: Secondary | ICD-10-CM | POA: Insufficient documentation

## 2021-09-02 NOTE — Progress Notes (Signed)
Patient feeling some better, discussed tylenol and warm baths to help with back discomfort as well as staying hydrated, she chose to take tylenol at home and took a large water pitcher with her. She was advised to keep regular follow up and knows when to return to the hospital.

## 2021-09-02 NOTE — OB Triage Note (Signed)
Pt arrived to unit with c/o contractions/back pain and pelvic pressure throughout the day starting around 1200. Denies Uirnary symptoms, bleeding or LOF. + FM. Continue to assess.

## 2021-09-03 NOTE — Final Progress Note (Signed)
L&D OB Triage Note  Tiffany Wiley is a 30 y.o. [id number] female at [redacted]w[redacted]d, EDD Estimated Date of Delivery: 10/04/21 who presented to triage for complaints of contractions/back pain, pelvic pressure since 12:00 pm today. 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 She was evaluated by the nurses with no significant findings significant for preterm labor. Vital signs stable. An NST was performed and has been reviewed by MD.    NST INTERPRETATION: Indications: rule out uterine contractions  Mode: External Baseline Rate (A): 145 bpm Variability: Moderate Accelerations: 15 x 15 Decelerations: None     Contraction Frequency (min): iritibility  Impression: reactive   Plan: NST performed was reviewed and was found to be reactive. She was discharged home with bleeding/preterm labor precautions.  Continue routine prenatal care. Follow up with OB/GYN as previously scheduled.     Hildred Laser, MD [facility]

## 2021-09-06 ENCOUNTER — Encounter: Payer: Self-pay | Admitting: Obstetrics and Gynecology

## 2021-09-06 ENCOUNTER — Other Ambulatory Visit: Payer: Self-pay

## 2021-09-06 ENCOUNTER — Other Ambulatory Visit (INDEPENDENT_AMBULATORY_CARE_PROVIDER_SITE_OTHER): Payer: Self-pay

## 2021-09-06 ENCOUNTER — Ambulatory Visit (INDEPENDENT_AMBULATORY_CARE_PROVIDER_SITE_OTHER): Payer: Self-pay | Admitting: Obstetrics and Gynecology

## 2021-09-06 VITALS — BP 119/81 | HR 97 | Wt 168.7 lb

## 2021-09-06 DIAGNOSIS — Z2821 Immunization not carried out because of patient refusal: Secondary | ICD-10-CM

## 2021-09-06 DIAGNOSIS — O24415 Gestational diabetes mellitus in pregnancy, controlled by oral hypoglycemic drugs: Secondary | ICD-10-CM

## 2021-09-06 DIAGNOSIS — M549 Dorsalgia, unspecified: Secondary | ICD-10-CM

## 2021-09-06 DIAGNOSIS — O0993 Supervision of high risk pregnancy, unspecified, third trimester: Secondary | ICD-10-CM

## 2021-09-06 DIAGNOSIS — O99891 Other specified diseases and conditions complicating pregnancy: Secondary | ICD-10-CM

## 2021-09-06 DIAGNOSIS — Z3A36 36 weeks gestation of pregnancy: Secondary | ICD-10-CM

## 2021-09-06 LAB — POCT URINALYSIS DIPSTICK OB
Bilirubin, UA: NEGATIVE
Glucose, UA: NEGATIVE
Nitrite, UA: NEGATIVE
Spec Grav, UA: 1.01 (ref 1.010–1.025)
Urobilinogen, UA: 0.2 E.U./dL
pH, UA: 7 (ref 5.0–8.0)

## 2021-09-06 LAB — FETAL NONSTRESS TEST

## 2021-09-06 NOTE — Progress Notes (Signed)
ROB: Patient notes back pain (more on right). Has not done any heavy lifting. Was seen in triage earlier this week for back pain. Was ruled out for UTI or contractions. Discussed that it may be related to fetal positioning, given info from [employer]. Also noting pelvic pressure, recommend belly band. 36 week cultures done. Blood sugars reviewed, fastings remain elevated, advised to return to 2.5 mg dosing (currently halving dose), and can take 2 spoonfuls of peanut butter before bedtime to eliminate mid-night lows. Postprandials remain below 130 this week. NST performed today was reviewed and was found to be reactive.  Continue recommended antenatal testing and prenatal care. Discussed IOL at 39 weeks as she is on meds for GDM.   NONSTRESS TEST INTERPRETATION  INDICATIONS: GDM, on Glyburide  FHR baseline: 140 bpm RESULTS:Reactive COMMENTS: Uterine irritability and occasional contractions   PLAN: 1. Continue fetal kick counts twice a day. 2. Continue antepartum testing as scheduled-weekly

## 2021-09-06 NOTE — Patient Instructions (Signed)
Signs and Symptoms of Labor ?Labor is the body's natural process of moving the baby and the placenta out of the uterus. The process of labor usually starts when the baby is full-term, between 39 and 41 weeks of pregnancy. ?Signs and symptoms that you are close to going into labor ?As your body prepares for labor and the birth of your baby, you may notice the following symptoms in the weeks and days before true labor starts: ?Passing a small amount of thick, bloody mucus from your vagina. This is called normal bloody show or losing your mucus plug. This may happen more than a week before labor begins, or right before labor begins, as the opening of the cervix starts to widen (dilate). For some women, the entire mucus plug passes at once. For others, pieces of the mucus plug may gradually pass over several days. ?Your baby moving (dropping) lower in your pelvis to get into position for birth (lightening). When this happens, you may feel more pressure on your bladder and pelvic bone and less pressure on your ribs. This may make it easier to breathe. It may also cause you to need to urinate more often and have problems with bowel movements. ?Having "practice contractions," also called Braxton Hicks contractions or false labor. These occur at irregular (unevenly spaced) intervals that are more than 10 minutes apart. False labor contractions are common after exercise or sexual activity. They will stop if you change position, rest, or drink fluids. These contractions are usually mild and do not get stronger over time. They may feel like: ?A backache or back pain. ?Mild cramps, similar to menstrual cramps. ?Tightening or pressure in your abdomen. ?Other early symptoms include: ?Nausea or loss of appetite. ?Diarrhea. ?Having a sudden burst of energy, or feeling very tired. ?Mood changes. ?Having trouble sleeping. ?Signs and symptoms that labor has begun ?Signs that you are in labor may include: ?Having contractions that come  at regular (evenly spaced) intervals and increase in intensity. This may feel like more intense tightening or pressure in your abdomen that moves to your back. ?Contractions may also feel like rhythmic pain in your upper thighs or back that comes and goes at regular intervals. ?If you are delivering for the first time, this change in intensity of contractions often occurs at a more gradual pace. ?If you have given birth before, you may notice a more rapid progression of contraction changes. ?Feeling pressure in the vaginal area. ?Your water breaking (rupture of membranes). This is when the sac of fluid that surrounds your baby breaks. Fluid leaking from your vagina may be clear or blood-tinged. Labor usually starts within 24 hours of your water breaking, but it may take longer to begin. ?Some people may feel a sudden gush of fluid; others may notice repeatedly damp underwear. ?Follow these instructions at home: ? ?When labor starts, or if your water breaks, call your health care provider or nurse care line. Based on your situation, they will determine when you should go in for an exam. ?During early labor, you may be able to rest and manage symptoms at home. Some strategies to try at home include: ?Breathing and relaxation techniques. ?Taking a warm bath or shower. ?Listening to music. ?Using a heating pad on the lower back for pain. If directed, apply heat to the area as often as told by your health care provider. Use the heat source that your health care provider recommends, such as a moist heat pack or a heating pad. ?Place a   towel between your skin and the heat source. ?Leave the heat on for 20-30 minutes. ?Remove the heat if your skin turns bright red. This is especially important if you are unable to feel pain, heat, or cold. You have a greater risk of getting burned. ?Contact a health care provider if: ?Your labor has started. ?Your water breaks. ?You have nausea, vomiting, or diarrhea. ?Get help right away  if: ?You have painful, regular contractions that are 5 minutes apart or less. ?Labor starts before you are [redacted] weeks along in your pregnancy. ?You have a fever. ?You have bright red blood coming from your vagina. ?You do not feel your baby moving. ?You have a severe headache with or without vision problems. ?You have chest pain or shortness of breath. ?These symptoms may represent a serious problem that is an emergency. Do not wait to see if the symptoms will go away. Get medical help right away. Call your local emergency services (911 in the [place] Do not drive yourself to the hospital. ?Summary ?Labor is your body's natural process of moving your baby and the placenta out of your uterus. ?The process of labor usually starts when your baby is full-term, between 39 and 40 weeks of pregnancy. ?When labor starts, or if your water breaks, call your health care provider or nurse care line. Based on your situation, they will determine when you should go in for an exam. ?This information is not intended to replace advice given to you by your health care provider. Make sure you discuss any questions you have with your health care provider. ?Document Revised: 02/26/2021 Document Reviewed: 02/26/2021 ?Elsevier Patient Education ? 2022 [employer] ? ?

## 2021-09-06 NOTE — Progress Notes (Signed)
ROB: She was having some back pain and was evaluated at ED. She continues to have some pain and pressure today. Cultures done today. She declines flu injection today.

## 2021-09-08 LAB — STREP GP B NAA: Strep Gp B NAA: NEGATIVE

## 2021-09-10 ENCOUNTER — Other Ambulatory Visit: Payer: Self-pay

## 2021-09-10 ENCOUNTER — Encounter: Payer: Self-pay | Admitting: Obstetrics and Gynecology

## 2021-09-10 ENCOUNTER — Ambulatory Visit (INDEPENDENT_AMBULATORY_CARE_PROVIDER_SITE_OTHER): Payer: Self-pay | Admitting: Obstetrics and Gynecology

## 2021-09-10 VITALS — BP 101/68 | HR 85 | Wt 169.2 lb

## 2021-09-10 DIAGNOSIS — Z3483 Encounter for supervision of other normal pregnancy, third trimester: Secondary | ICD-10-CM

## 2021-09-10 DIAGNOSIS — Z3A36 36 weeks gestation of pregnancy: Secondary | ICD-10-CM

## 2021-09-10 LAB — POCT URINALYSIS DIPSTICK OB
Bilirubin, UA: NEGATIVE
Glucose, UA: NEGATIVE
Ketones, UA: NEGATIVE
Nitrite, UA: NEGATIVE
Spec Grav, UA: 1.015 (ref 1.010–1.025)
Urobilinogen, UA: 0.2 E.U./dL
pH, UA: 6.5 (ref 5.0–8.0)

## 2021-09-10 LAB — FETAL NONSTRESS TEST

## 2021-09-10 LAB — GC/CHLAMYDIA PROBE AMP
Chlamydia trachomatis, NAA: NEGATIVE
Neisseria Gonorrhoeae by PCR: NEGATIVE

## 2021-09-10 NOTE — Progress Notes (Signed)
ROB: No complaints.  Describes mild cramping but no "real contractions".  Patient doing very good job with her sugar log.  Results improved since increasing her dosing as previously directed.  NST reactive today.

## 2021-09-10 NOTE — Progress Notes (Signed)
ROB: She has been having some pressure, otherwise no concerns.

## 2021-09-12 ENCOUNTER — Inpatient Hospital Stay: Payer: Medicaid Other | Admitting: Anesthesiology

## 2021-09-12 ENCOUNTER — Inpatient Hospital Stay
Admission: EM | Admit: 2021-09-12 | Discharge: 2021-09-14 | DRG: 807 | Disposition: A | Discharge status: Home / Self Care | Payer: Medicaid Other | Attending: Obstetrics and Gynecology | Admitting: Obstetrics and Gynecology

## 2021-09-12 ENCOUNTER — Other Ambulatory Visit: Payer: Self-pay

## 2021-09-12 ENCOUNTER — Encounter: Payer: Self-pay | Admitting: Obstetrics and Gynecology

## 2021-09-12 DIAGNOSIS — Z8616 Personal history of COVID-19: Secondary | ICD-10-CM | POA: Diagnosis not present

## 2021-09-12 DIAGNOSIS — O42913 Preterm premature rupture of membranes, unspecified as to length of time between rupture and onset of labor, third trimester: Secondary | ICD-10-CM | POA: Diagnosis present

## 2021-09-12 DIAGNOSIS — O4290 Premature rupture of membranes, unspecified as to length of time between rupture and onset of labor, unspecified weeks of gestation: Secondary | ICD-10-CM | POA: Diagnosis present

## 2021-09-12 DIAGNOSIS — O09293 Supervision of pregnancy with other poor reproductive or obstetric history, third trimester: Secondary | ICD-10-CM

## 2021-09-12 DIAGNOSIS — Z3A36 36 weeks gestation of pregnancy: Secondary | ICD-10-CM

## 2021-09-12 DIAGNOSIS — O42013 Preterm premature rupture of membranes, onset of labor within 24 hours of rupture, third trimester: Secondary | ICD-10-CM

## 2021-09-12 DIAGNOSIS — O24425 Gestational diabetes mellitus in childbirth, controlled by oral hypoglycemic drugs: Secondary | ICD-10-CM | POA: Diagnosis present

## 2021-09-12 DIAGNOSIS — O24419 Gestational diabetes mellitus in pregnancy, unspecified control: Secondary | ICD-10-CM | POA: Diagnosis present

## 2021-09-12 DIAGNOSIS — O0990 Supervision of high risk pregnancy, unspecified, unspecified trimester: Secondary | ICD-10-CM

## 2021-09-12 DIAGNOSIS — Z20822 Contact with and (suspected) exposure to covid-19: Secondary | ICD-10-CM | POA: Diagnosis present

## 2021-09-12 DIAGNOSIS — O24415 Gestational diabetes mellitus in pregnancy, controlled by oral hypoglycemic drugs: Secondary | ICD-10-CM

## 2021-09-12 LAB — RESP PANEL BY RT-PCR (FLU A&B, COVID) ARPGX2
Influenza A by PCR: NEGATIVE
Influenza B by PCR: NEGATIVE
SARS Coronavirus 2 by RT PCR: NEGATIVE

## 2021-09-12 LAB — RUPTURE OF MEMBRANE (ROM)PLUS: Rom Plus: POSITIVE

## 2021-09-12 LAB — CBC
HCT: 33.4 % — ABNORMAL LOW (ref 36.0–46.0)
Hemoglobin: 11.1 g/dL — ABNORMAL LOW (ref 12.0–15.0)
MCH: 29.4 pg (ref 26.0–34.0)
MCHC: 33.2 g/dL (ref 30.0–36.0)
MCV: 88.6 fL (ref 80.0–100.0)
Platelets: 173 10*3/uL (ref 150–400)
RBC: 3.77 MIL/uL — ABNORMAL LOW (ref 3.87–5.11)
RDW: 13 % (ref 11.5–15.5)
WBC: 9.1 10*3/uL (ref 4.0–10.5)
nRBC: 0 % (ref 0.0–0.2)

## 2021-09-12 LAB — TYPE AND SCREEN
ABO/RH(D): AB POS
Antibody Screen: NEGATIVE

## 2021-09-12 LAB — GLUCOSE, CAPILLARY: Glucose-Capillary: 82 mg/dL (ref 70–99)

## 2021-09-12 MED ORDER — DIPHENHYDRAMINE HCL 50 MG/ML IJ SOLN: 12.5000 mg | INTRAMUSCULAR | Status: DC | PRN

## 2021-09-12 MED ORDER — LIDOCAINE HCL (PF) 1 % IJ SOLN
INTRAMUSCULAR | Status: DC | PRN
  Administered 2021-09-12: 3 mL via SUBCUTANEOUS

## 2021-09-12 MED ORDER — BUTORPHANOL TARTRATE 1 MG/ML IJ SOLN: 1.0000 mg | INTRAMUSCULAR | Status: DC | PRN

## 2021-09-12 MED ORDER — FENTANYL-BUPIVACAINE-NACL 0.5-0.125-0.9 MG/250ML-% EP SOLN
12.0000 mL/h | EPIDURAL | Status: DC | PRN
  Administered 2021-09-12: 12 mL/h via EPIDURAL

## 2021-09-12 MED ORDER — LACTATED RINGERS IV SOLN: INTRAVENOUS | Status: DC

## 2021-09-12 MED ORDER — LIDOCAINE-EPINEPHRINE (PF) 1.5 %-1:200000 IJ SOLN
INTRAMUSCULAR | Status: DC | PRN
  Administered 2021-09-12: 4 mL via EPIDURAL

## 2021-09-12 MED ORDER — EPHEDRINE 5 MG/ML INJ
10.0000 mg | INTRAVENOUS | Status: DC | PRN
  Filled 2021-09-12: qty 2

## 2021-09-12 MED ORDER — OXYCODONE-ACETAMINOPHEN 5-325 MG PO TABS: 2.0000 | ORAL_TABLET | ORAL | Status: DC | PRN

## 2021-09-12 MED ORDER — LACTATED RINGERS IV SOLN: 500.0000 mL | INTRAVENOUS | Status: DC | PRN

## 2021-09-12 MED ORDER — PHENYLEPHRINE 40 MCG/ML (10ML) SYRINGE FOR IV PUSH (FOR BLOOD PRESSURE SUPPORT)
80.0000 ug | PREFILLED_SYRINGE | INTRAVENOUS | Status: DC | PRN
  Filled 2021-09-12: qty 10

## 2021-09-12 MED ORDER — LACTATED RINGERS IV SOLN: 500.0000 mL | Freq: Once | INTRAVENOUS | Status: DC

## 2021-09-12 MED ORDER — LIDOCAINE HCL (PF) 1 % IJ SOLN
30.0000 mL | INTRAMUSCULAR | Status: DC | PRN
  Filled 2021-09-12: qty 30

## 2021-09-12 MED ORDER — ONDANSETRON HCL 4 MG/2ML IJ SOLN: 4.0000 mg | Freq: Four times a day (QID) | INTRAMUSCULAR | Status: DC | PRN

## 2021-09-12 MED ORDER — SOD CITRATE-CITRIC ACID 500-334 MG/5ML PO SOLN: 30.0000 mL | ORAL | Status: DC | PRN

## 2021-09-12 MED ORDER — BUPIVACAINE HCL (PF) 0.25 % IJ SOLN
INTRAMUSCULAR | Status: DC | PRN
  Administered 2021-09-12: 3 mL via EPIDURAL
  Administered 2021-09-12: 5 mL via EPIDURAL

## 2021-09-12 MED ORDER — OXYTOCIN BOLUS FROM INFUSION
333.0000 mL | Freq: Once | INTRAVENOUS | Status: AC
  Administered 2021-09-13: 333 mL via INTRAVENOUS

## 2021-09-12 MED ORDER — OXYCODONE-ACETAMINOPHEN 5-325 MG PO TABS: 1.0000 | ORAL_TABLET | ORAL | Status: DC | PRN

## 2021-09-12 MED ORDER — FENTANYL-BUPIVACAINE-NACL 0.5-0.125-0.9 MG/250ML-% EP SOLN
EPIDURAL | Status: AC
  Filled 2021-09-12: qty 250

## 2021-09-12 MED ORDER — OXYTOCIN-SODIUM CHLORIDE 30-0.9 UT/500ML-% IV SOLN
1.0000 m[IU]/min | INTRAVENOUS | Status: DC
  Administered 2021-09-12: 2 m[IU]/min via INTRAVENOUS

## 2021-09-12 MED ORDER — OXYTOCIN-SODIUM CHLORIDE 30-0.9 UT/500ML-% IV SOLN: 2.5000 [IU]/h | INTRAVENOUS | Status: DC

## 2021-09-12 MED ORDER — ACETAMINOPHEN 325 MG PO TABS
650.0000 mg | ORAL_TABLET | ORAL | Status: DC | PRN
  Administered 2021-09-13: 650 mg via ORAL
  Filled 2021-09-12: qty 2

## 2021-09-12 MED ORDER — TERBUTALINE SULFATE 1 MG/ML IJ SOLN: 0.2500 mg | Freq: Once | INTRAMUSCULAR | Status: DC | PRN

## 2021-09-12 NOTE — OB Triage Note (Signed)
Pt presents c/o ctx, bloody show and LOF since about 5 pm. Pt states ctx are anywhere from 3-5 mins apart. Pt reports positive fetal movement. VSS Will continue to monitor.

## 2021-09-12 NOTE — H&P (Signed)
Obstetric History and Physical  Tiffany Wiley is a 30 y.o. G3P2002 with IUP at [redacted]w[redacted]d presenting for ruptured membranes (clear fluid) with onset of contractions and and bloody show since 5 pm this evening. She has been having regular q 3-5 minute contractions with active fetal movement.    Of note, current pregnancy is complicated by gestational diabetes, on Glyburide.   Prenatal Course Source of Care: [facility] with onset of care at 13 weeks Pregnancy complications or risks: Patient Active Problem List   Diagnosis Date Noted   PROM (premature rupture of membranes) 09/12/2021   Indication for care in labor and delivery, antepartum 09/02/2021   History of gestational diabetes in prior pregnancy, currently pregnant in third trimester 04/09/2021   Gestational diabetes 10/06/2018   Supervision of high risk pregnancy, antepartum 05/04/2018   Generalized anxiety disorder 09/30/2017   She plans to breastfeed She desires  unsure method  for postpartum contraception.   Prenatal labs and studies: ABO, Rh: --/--/PENDING (11/17 2303) Antibody: PENDING (11/17 2303) Rubella: 2.60 (05/13 1356) RPR: Non Reactive (09/13 0821)  HBsAg:    HIV: Non Reactive (05/13 1356)  YSA:YTKZSWFU/-- (11/11 1147) 1 hr Glucola  abnormal (179).  Patient declined 3 hr GTT, monitored blood sugars. Genetic screening normal Anatomy US normal   Past Medical History:  Diagnosis Date   COVID-19 08/2019   Generalized anxiety disorder 09/30/2017   Gestational diabetes    Recurrent upper respiratory infection (URI)     Past Surgical History:  Procedure Laterality Date   NO PAST SURGERIES      OB History  Gravida Para Term Preterm AB Living  3 2 2  0 0 2  SAB IAB Ectopic Multiple Live Births  0 0 0 0 2    # Outcome Date GA Lbr Len/2nd Weight Sex Delivery Anes PTL Lv  3 Current           2 Term 12/23/18 [redacted]w[redacted]d / 00:33 3880 g M Vag-Spont EPI  LIV  1 Term 09/08/15 [redacted]w[redacted]d  3060 g M Vag-Spont EPI   LIV    Social History   Socioeconomic History   Marital status: Married    Spouse name: JR   Number of children: Not on file   Years of education: Not on file   Highest education level: Not on file  Occupational History   Not on file  Tobacco Use   Smoking status: Never   Smokeless tobacco: Never  Vaping Use   Vaping Use: Never used  Substance and Sexual Activity   Alcohol use: No   Drug use: No   Sexual activity: Yes    Partners: Male    Birth control/protection: Surgical    Comment: vasectomy  Other Topics Concern   Not on file  Social History Narrative   Not on file   Social Determinants of Health   Financial Resource Strain: Not on file  Food Insecurity: Not on file  Transportation Needs: Not on file  Physical Activity: Not on file  Stress: Not on file  Social Connections: Not on file    Family History  Problem Relation Age of Onset   Breast cancer Other    Healthy Mother     Medications Prior to Admission  Medication Sig Dispense Refill Last Dose   glyBURIDE (DIABETA) 2.5 MG tablet Take 1 tablet (2.5 mg total) by mouth 2 (two) times daily with a meal. 60 tablet 3 09/12/2021   Prenatal Vit-Fe Fumarate-FA (MULTIVITAMIN-PRENATAL) 27-0.8 MG TABS tablet Take  1 tablet by mouth daily at 12 noon.   09/11/2021    Allergies  Allergen Reactions   Other Hives, Shortness Of Breath and Swelling    Blueberries   Shellfish Allergy Hives and Swelling    Review of Systems: Negative except for what is mentioned in HPI.  Physical Exam: BP 117/83 (BP Location: Left Arm)   Pulse (!) 103   Temp 98.3 F (36.8 C) (Oral)   Resp 18   Ht 5\' 3"  (1.6 m)   Wt 76.7 kg   LMP 12/03/2020   SpO2 100%   BMI 29.97 kg/m  CONSTITUTIONAL: Well-developed, well-nourished female in no acute distress.  HENT:  Normocephalic, atraumatic, External right and left ear normal. Oropharynx is clear and moist EYES: Conjunctivae and EOM are normal. Pupils are equal, round, and reactive to  light. No scleral icterus.  NECK: Normal range of motion, supple, no masses SKIN: Skin is warm and dry. No rash noted. Not diaphoretic. No erythema. No pallor. NEUROLOGIC: Alert and oriented to person, place, and time. Normal reflexes, muscle tone coordination. No cranial nerve deficit noted. PSYCHIATRIC: Normal mood and affect. Normal behavior. Normal judgment and thought content. CARDIOVASCULAR: Normal heart rate noted, regular rhythm RESPIRATORY: Effort and breath sounds normal, no problems with respiration noted ABDOMEN: Soft, nontender, nondistended, gravid. MUSCULOSKELETAL: Normal range of motion. No edema and no tenderness. 2+ distal pulses.  Cervical Exam: Dilatation 4 cm   Effacement 70%   Station -*2   Presentation: cephalic FHT:  Baseline rate 130 bpm   Variability moderate  Accelerations absent   Decelerations none Contractions: Every 2-4 mins   Pertinent Labs/Studies:   Results for orders placed or performed during the hospital encounter of 09/12/21 (from the past 24 hour(s))  ROM Plus ([facility] only)     Status: None   Collection Time: 09/12/21  8:50 PM  Result Value Ref Range   Rom Plus POSITIVE   Resp Panel by RT-PCR (Flu A&B, Covid) Nasopharyngeal Swab     Status: None   Collection Time: 09/12/21  8:50 PM   Specimen: Nasopharyngeal Swab; Nasopharyngeal(NP) swabs in vial transport medium  Result Value Ref Range   SARS Coronavirus 2 by RT PCR NEGATIVE NEGATIVE   Influenza A by PCR NEGATIVE NEGATIVE   Influenza B by PCR NEGATIVE NEGATIVE  CBC     Status: Abnormal   Collection Time: 09/12/21 11:03 PM  Result Value Ref Range   WBC 9.1 4.0 - 10.5 K/uL   RBC 3.77 (L) 3.87 - 5.11 MIL/uL   Hemoglobin 11.1 (L) 12.0 - 15.0 g/dL   HCT 09/14/21 (L) 08.8 - 11.0 %   MCV 88.6 80.0 - 100.0 fL   MCH 29.4 26.0 - 34.0 pg   MCHC 33.2 30.0 - 36.0 g/dL   RDW 31.5 94.5 - 85.9 %   Platelets 173 150 - 400 K/uL   nRBC 0.0 0.0 - 0.2 %  Type and screen [facility]      Status: None (Preliminary result)   Collection Time: 09/12/21 11:03 PM  Result Value Ref Range   ABO/RH(D) PENDING    Antibody Screen PENDING    Sample Expiration      09/15/2021,2359 Performed at [facility], [address] [place], [place]   Glucose, capillary     Status: None   Collection Time: 09/12/21 11:19 PM  Result Value Ref Range   Glucose-Capillary 82 70 - 99 mg/dL   Comment 1 Notify RN  Assessment : Tiffany Wiley is a 30 y.o. G3P2002 at [redacted]w[redacted]d being admitted for PROM with onset of labor. Gestational Diabetes on Glyburide.   Plan: Labor: Expectant management. Augmentation if needed with Pitocin as per protocol. Analgesia as needed. FWB: Reassuring fetal heart tracing.  GBS negative Delivery plan: Hopeful for vaginal delivery   Hildred Laser, MD [facility]

## 2021-09-12 NOTE — Anesthesia Procedure Notes (Signed)
Epidural Patient location during procedure: OB  Staffing Anesthesiologist: Piscitello, Cleda Mccreedy, MD Performed: anesthesiologist   Preanesthetic Checklist Completed: patient identified, IV checked, site marked, risks and benefits discussed, surgical consent, monitors and equipment checked, pre-op evaluation and timeout performed  Epidural Patient position: sitting Prep: ChloraPrep Patient monitoring: heart rate, continuous pulse ox and blood pressure Approach: midline Location: L3-L4 Injection technique: LOR saline  Needle:  Needle type: Tuohy  Needle gauge: 17 G Needle length: 9 cm and 9 Needle insertion depth: 5 cm Catheter type: closed end flexible Catheter size: 19 Gauge Catheter at skin depth: 11 cm Test dose: negative and 1.5% lidocaine with Epi 1:200 K  Assessment Sensory level: T10 Events: blood not aspirated, injection not painful, no injection resistance, no paresthesia and negative IV test  Additional Notes 1st attempt Pt. Evaluated and documentation done after procedure finished. Patient identified. Risks/Benefits/Options discussed with patient including but not limited to bleeding, infection, nerve damage, paralysis, failed block, incomplete pain control, headache, blood pressure changes, nausea, vomiting, reactions to medication both or allergic, itching and postpartum back pain. Confirmed with bedside nurse the patient's most recent platelet count. Confirmed with patient that they are not currently taking any anticoagulation, have any bleeding history or any family history of bleeding disorders. Patient expressed understanding and wished to proceed. All questions were answered. Sterile technique was used throughout the entire procedure. Please see nursing notes for vital signs. Test dose was given through epidural catheter and negative prior to continuing to dose epidural or start infusion. Warning signs of high block given to the patient including shortness of  breath, tingling/numbness in hands, complete motor block, or any concerning symptoms with instructions to call for help. Patient was given instructions on fall risk and not to get out of bed. All questions and concerns addressed with instructions to call with any issues or inadequate analgesia.    Patient tolerated the insertion well without immediate complications.Reason for block:procedure for pain

## 2021-09-12 NOTE — Anesthesia Preprocedure Evaluation (Signed)
Anesthesia Evaluation  Patient identified by MRN, date of birth, ID band Patient awake    Reviewed: Allergy & Precautions, H&P , NPO status , Patient's Chart, lab work & pertinent test results, reviewed documented beta blocker date and time   Airway Mallampati: II  TM Distance: >3 FB Neck ROM: full    Dental no notable dental hx. (+) Teeth Intact   Pulmonary Recent URI , Resolved,    Pulmonary exam normal breath sounds clear to auscultation       Cardiovascular Exercise Tolerance: Good negative cardio ROS   Rhythm:regular Rate:Normal     Neuro/Psych PSYCHIATRIC DISORDERS Anxiety negative neurological ROS     GI/Hepatic negative GI ROS, Neg liver ROS,   Endo/Other  negative endocrine ROSdiabetes, Gestational  Renal/GU      Musculoskeletal   Abdominal   Peds  Hematology negative hematology ROS (+)   Anesthesia Other Findings   Reproductive/Obstetrics (+) Pregnancy                             Anesthesia Physical Anesthesia Plan  ASA: 2  Anesthesia Plan: Epidural   Post-op Pain Management:    Induction:   PONV Risk Score and Plan:   Airway Management Planned:   Additional Equipment:   Intra-op Plan:   Post-operative Plan:   Informed Consent: I have reviewed the patients History and Physical, chart, labs and discussed the procedure including the risks, benefits and alternatives for the proposed anesthesia with the patient or authorized representative who has indicated his/her understanding and acceptance.       Plan Discussed with:   Anesthesia Plan Comments:         Anesthesia Quick Evaluation

## 2021-09-13 DIAGNOSIS — Z3A37 37 weeks gestation of pregnancy: Secondary | ICD-10-CM

## 2021-09-13 LAB — RPR: RPR Ser Ql: NONREACTIVE

## 2021-09-13 LAB — GLUCOSE, CAPILLARY: Glucose-Capillary: 82 mg/dL (ref 70–99)

## 2021-09-13 MED ORDER — BENZOCAINE-MENTHOL 20-0.5 % EX AERO: 1.0000 "application " | INHALATION_SPRAY | CUTANEOUS | Status: DC | PRN

## 2021-09-13 MED ORDER — IBUPROFEN 600 MG PO TABS
600.0000 mg | ORAL_TABLET | Freq: Four times a day (QID) | ORAL | Status: DC
  Administered 2021-09-13 – 2021-09-14 (×4): 600 mg via ORAL
  Filled 2021-09-13 (×4): qty 1

## 2021-09-13 MED ORDER — ACETAMINOPHEN 325 MG PO TABS
650.0000 mg | ORAL_TABLET | ORAL | Status: DC | PRN
  Administered 2021-09-13: 650 mg via ORAL
  Filled 2021-09-13: qty 2

## 2021-09-13 MED ORDER — SENNOSIDES-DOCUSATE SODIUM 8.6-50 MG PO TABS: 2.0000 | ORAL_TABLET | Freq: Every day | ORAL | Status: DC

## 2021-09-13 MED ORDER — ZOLPIDEM TARTRATE 5 MG PO TABS: 5.0000 mg | ORAL_TABLET | Freq: Every evening | ORAL | Status: DC | PRN

## 2021-09-13 MED ORDER — PRENATAL MULTIVITAMIN CH
1.0000 | ORAL_TABLET | Freq: Every day | ORAL | Status: DC
  Administered 2021-09-13: 1 via ORAL
  Filled 2021-09-13: qty 1

## 2021-09-13 MED ORDER — COCONUT OIL OIL
1.0000 "application " | TOPICAL_OIL | Status: DC | PRN
  Filled 2021-09-13: qty 120

## 2021-09-13 MED ORDER — WITCH HAZEL-GLYCERIN EX PADS: 1.0000 "application " | MEDICATED_PAD | CUTANEOUS | Status: DC | PRN

## 2021-09-13 MED ORDER — ONDANSETRON HCL 4 MG PO TABS: 4.0000 mg | ORAL_TABLET | ORAL | Status: DC | PRN

## 2021-09-13 MED ORDER — DIPHENHYDRAMINE HCL 25 MG PO CAPS: 25.0000 mg | ORAL_CAPSULE | Freq: Four times a day (QID) | ORAL | Status: DC | PRN

## 2021-09-13 MED ORDER — DIBUCAINE (PERIANAL) 1 % EX OINT: 1.0000 "application " | TOPICAL_OINTMENT | CUTANEOUS | Status: DC | PRN

## 2021-09-13 MED ORDER — SIMETHICONE 80 MG PO CHEW: 80.0000 mg | CHEWABLE_TABLET | ORAL | Status: DC | PRN

## 2021-09-13 MED ORDER — ONDANSETRON HCL 4 MG/2ML IJ SOLN: 4.0000 mg | INTRAMUSCULAR | Status: DC | PRN

## 2021-09-13 NOTE — Lactation Note (Signed)
This note was copied from a baby's chart. Lactation Consultation Note  Patient Name: Tiffany Wiley [id number]'[id number] Date: 09/13/2021 Reason for consult: Initial assessment;NICU baby;Early term 37-38.6wks Age:31 hours  Maternal Data Does the patient have breastfeeding experience prior to this delivery?: Yes How long did the patient breastfeed?: 6 mths  Feeding Mother's Current Feeding Choice: Breast Milk Baby NPO, in SCN, mom set up with DEBP symphony, pumped 20 cc colostrum, labeled and sent to SCN and stored in breastmilk refrig  LATCH Score                    Lactation Tools Discussed/Used Tools: Pump Breast pump type: Double-Electric Breast Pump Pump Education: Setup, frequency, and cleaning;Milk Storage Reason for Pumping: baby in SCN Pumping frequency: q 3h Pumped volume: 20 mL  Interventions Interventions: DEBP;Education LC name and no written on white board along with pumping frequency Discharge Pump: Personal WIC Program: No  Consult Status Consult Status: PRN    Dyann Kief 09/13/2021, 10:52 AM

## 2021-09-14 NOTE — Discharge Summary (Signed)
Patient Name: Jenean Perezlopez DOB: October 09, 1991 MRN: [id number]                            Discharge Summary  Date of Admission: 09/12/2021 Date of Discharge: 09/14/2021 Delivering Provider: Rubie Maid   Admitting Diagnosis: PROM (premature rupture of membranes) [O42.90] at [redacted]w[redacted]d Secondary diagnosis:  Principal Problem:   PROM (premature rupture of membranes) Active Problems:   Supervision of high risk pregnancy, antepartum   Gestational diabetes   History of gestational diabetes in prior pregnancy, currently pregnant in third trimester   Mode of Delivery: normal spontaneous vaginal delivery              Discharge diagnosis: Term Pregnancy Delivered       Post partum procedures:   Complications: none                     Discharge Day SOAP Note:  Progress Note - Vaginal Delivery  Mahum Lashante Gibbon is a 30 y.o. G3P2002 now PP day 1 s/p Vaginal, Spontaneous . Delivery was uncomplicated  Subjective  The patient has the following complaints: has no unusual complaints  Pain is controlled with current medications.   Patient is urinating without difficulty.  She is ambulating well.   Pt desires discharge.  Objective  Vital signs: BP 113/83 (BP Location: Right Arm)   Pulse 92   Temp 98.7 F (37.1 C) (Oral)   Resp 16   Ht 5\' 3"  (1.6 m)   Wt 76.7 kg   LMP 12/03/2020   SpO2 100%   BMI 29.97 kg/m   Physical Exam: Gen: NAD Fundus Fundal Tone: Firm  Lochia Amount: Scant        Data Review Labs: Lab Results  Component Value Date   WBC 9.1 09/12/2021   HGB 11.1 (L) 09/12/2021   HCT 33.4 (L) 09/12/2021   MCV 88.6 09/12/2021   PLT 173 09/12/2021   CBC Latest Ref Rng & Units 09/12/2021 07/09/2021 12/24/2018  WBC 4.0 - 10.5 K/uL 9.1 6.4 10.2  Hemoglobin 12.0 - 15.0 g/dL 11.1(L) 11.5 8.6(L)  Hematocrit 36.0 - 46.0 % 33.4(L) 33.1(L) 27.1(L)  Platelets 150 - 400 K/uL 173 159 137(L)   AB POS  Edinburgh Score: Edinburgh Postnatal Depression Scale Screening  Tool 09/13/2021  I have been able to laugh and see the funny side of things. 0  I have looked forward with enjoyment to things. 0  I have blamed myself unnecessarily when things went wrong. 2  I have been anxious or worried for no good reason. 0  I have felt scared or panicky for no good reason. 1  Things have been getting on top of me. 0  I have been so unhappy that I have had difficulty sleeping. 0  I have felt sad or miserable. 0  I have been so unhappy that I have been crying. 0  The thought of harming myself has occurred to me. 0  Edinburgh Postnatal Depression Scale Total 3    Assessment/Plan  Principal Problem:   PROM (premature rupture of membranes) Active Problems:   Supervision of high risk pregnancy, antepartum   Gestational diabetes   History of gestational diabetes in prior pregnancy, currently pregnant in third trimester    Plan for discharge today.  Discharge Instructions: Per After Visit Summary. Activity: Advance as tolerated. Pelvic rest for 6 weeks.  Also refer to After Visit Summary Diet: Regular  Medications: Allergies as of 09/14/2021       Reactions   Other Hives, Shortness Of Breath, Swelling   Blueberries   Shellfish Allergy Hives, Swelling        Medication List     TAKE these medications    glyBURIDE 2.5 MG tablet Commonly known as: DIABETA Take 1 tablet (2.5 mg total) by mouth 2 (two) times daily with a meal.   multivitamin-prenatal 27-0.8 MG Tabs tablet Take 1 tablet by mouth daily at 12 noon.       Outpatient follow up:   Follow-up Information     Hildred Laser, MD Follow up in 2 week(s).   Specialties: Obstetrics and Gynecology, Radiology Why: May do video visit Contact information: [address] [place] [address] [phone]                Postpartum contraception: Will discuss at first office visit post-partum  Discharged Condition: good  Discharged to: home  Newborn  Data: Disposition:NICU  Apgars: APGAR (1 MIN): 8   APGAR (5 MINS): 10   APGAR (10 MINS):    Baby Feeding: Breast - Mom pumping  Pt to resume glucose monitoring at home.  Advised to begin glyburide based on recorded monitoring. Pt verbalized understanding.   Elonda Husky, M.D. 09/14/2021 9:16 AM

## 2021-09-14 NOTE — Progress Notes (Signed)
Mother discharged.  Discharge instructions given.  Mother verbalizes understanding.  Transported by auxiliary.  

## 2021-09-14 NOTE — Discharge Instructions (Signed)

## 2021-09-16 ENCOUNTER — Other Ambulatory Visit: Payer: Self-pay

## 2021-09-16 ENCOUNTER — Encounter: Payer: Self-pay | Admitting: Obstetrics and Gynecology

## 2021-09-16 DIAGNOSIS — Z3A37 37 weeks gestation of pregnancy: Secondary | ICD-10-CM

## 2021-09-16 DIAGNOSIS — Z3483 Encounter for supervision of other normal pregnancy, third trimester: Secondary | ICD-10-CM

## 2021-09-23 NOTE — Anesthesia Postprocedure Evaluation (Signed)
Anesthesia Post Note  Patient: Tiffany Wiley  Procedure(s) Performed: AN AD HOC LABOR EPIDURAL  Patient location during evaluation: Mother Baby Anesthesia Type: Epidural Level of consciousness: awake and alert Pain management: pain level controlled Vital Signs Assessment: post-procedure vital signs reviewed and stable Respiratory status: spontaneous breathing, nonlabored ventilation and respiratory function stable Cardiovascular status: stable Postop Assessment: no headache, no backache and epidural receding Anesthetic complications: no   No notable events documented.   Last Vitals: There were no vitals filed for this visit.  Last Pain: There were no vitals filed for this visit.               Yevette Edwards

## 2022-01-15 ENCOUNTER — Ambulatory Visit
Admission: EM | Admit: 2022-01-15 | Discharge: 2022-01-15 | Disposition: A | Discharge status: Home / Self Care | Payer: Medicaid Other | Attending: Emergency Medicine | Admitting: Emergency Medicine

## 2022-01-15 ENCOUNTER — Other Ambulatory Visit: Payer: Self-pay

## 2022-01-15 DIAGNOSIS — J069 Acute upper respiratory infection, unspecified: Secondary | ICD-10-CM | POA: Diagnosis present

## 2022-01-15 LAB — GROUP A STREP BY PCR: Group A Strep by PCR: NOT DETECTED

## 2022-01-15 MED ORDER — AMOXICILLIN-POT CLAVULANATE 875-125 MG PO TABS
1.0000 | ORAL_TABLET | Freq: Two times a day (BID) | ORAL | 0 refills | Status: AC
Start: 2022-01-15 — End: 2022-01-25

## 2022-01-15 MED ORDER — LIDOCAINE VISCOUS HCL 2 % MT SOLN: 15.0000 mL | OROMUCOSAL | 0 refills | Status: AC | PRN

## 2022-01-15 NOTE — Discharge Instructions (Signed)
Your symptoms most likely initially started off as a viral infection however due to the timeline of your illness we will cover for bacteria in the upper airways, ? ?Take Augmentin twice daily for the next 10 days ? ?You may gargle and spit lidocaine solution every 4 hours for temporary numbing effect to your throat ? ?You may attempt any of the following below in addition ?   ?You can take Tylenol and/or Ibuprofen as needed for fever reduction and pain relief. ?  ?For cough: honey 1/2 to 1 teaspoon (you can dilute the honey in water or another fluid).  You can also use guaifenesin and dextromethorphan for cough. You can use a humidifier for chest congestion and cough.  If you don't have a humidifier, you can sit in the bathroom with the hot shower running.    ?  ?For sore throat: try warm salt water gargles, cepacol lozenges, throat spray, warm tea or water with lemon/honey, popsicles or ice, or OTC cold relief medicine for throat discomfort. ?  ?For congestion: take a daily anti-histamine like Zyrtec, Claritin, and a oral decongestant, such as pseudoephedrine.  You can also use Flonase 1-2 sprays in each nostril daily. ?  ?It is important to stay hydrated: drink plenty of fluids (water, gatorade/powerade/pedialyte, juices, or teas) to keep your throat moisturized and help further relieve irritation/discomfort.  ? ?

## 2022-01-15 NOTE — ED Provider Notes (Signed)
?[facility] ? ? ? ?CSN: [id number] ?Arrival date & time: 01/15/22  0930 ? ? ?  ? ?History   ?Chief Complaint ?Chief Complaint  ?Patient presents with  ? Cough  ? Nasal Congestion  ? Sore Throat  ? ? ?HPI ?Maecie Chasitity Sax is a 31 y.o. female.  ? ?Patient presents with congestion, rhinorrhea, nonproductive cough for 1-1/2 week, sore throat beginning 3 to 4 days ago.  Associated hoarseness which has resolved.  Child at home had similar symptoms first.  Painful to swallow but tolerating some food and liquids.  Has attempted use of Mucinex cold and flu which has been minimally helpful.   ? ?Past Medical History:  ?Diagnosis Date  ? COVID-19 08/2019  ? Generalized anxiety disorder 09/30/2017  ? Gestational diabetes   ? Recurrent upper respiratory infection (URI)   ? ? ?Patient Active Problem List  ? Diagnosis Date Noted  ? PROM (premature rupture of membranes) 09/12/2021  ? Indication for care in labor and delivery, antepartum 09/02/2021  ? History of gestational diabetes in prior pregnancy, currently pregnant in third trimester 04/09/2021  ? Gestational diabetes 10/06/2018  ? Supervision of high risk pregnancy, antepartum 05/04/2018  ? Generalized anxiety disorder 09/30/2017  ? ? ?Past Surgical History:  ?Procedure Laterality Date  ? NO PAST SURGERIES    ? ? ?OB History   ? ? Gravida  ?3  ? Para  ?2  ? Term  ?2  ? Preterm  ?0  ? AB  ?0  ? Living  ?2  ?  ? ? SAB  ?0  ? IAB  ?0  ? Ectopic  ?0  ? Multiple  ?0  ? Live Births  ?2  ?   ?  ?  ? ? ? ?Home Medications   ? ?Prior to Admission medications   ?Medication Sig Start Date End Date Taking? Authorizing Provider  ?dextromethorphan-guaiFENesin (MUCINEX DM) 30-600 MG 12hr tablet Take 1 tablet by mouth 2 (two) times daily.   Yes [provider]  ?glyBURIDE (DIABETA) 2.5 MG tablet Take 1 tablet (2.5 mg total) by mouth 2 (two) times daily with a meal. 08/06/21   Rubie Maid, MD  ?Prenatal Vit-Fe Fumarate-FA (MULTIVITAMIN-PRENATAL) 27-0.8 MG TABS tablet  Take 1 tablet by mouth daily at 12 noon.    [provider]  ? ? ?Family History ?Family History  ?Problem Relation Age of Onset  ? Breast cancer Other   ? Healthy Mother   ? ? ?Social History ?Social History  ? ?Tobacco Use  ? Smoking status: Never  ? Smokeless tobacco: Never  ?Vaping Use  ? Vaping Use: Never used  ?Substance Use Topics  ? Alcohol use: No  ? Drug use: No  ? ? ? ?Allergies   ?Other and Shellfish allergy ? ? ?Review of Systems ?Review of Systems  ?Constitutional: Negative.   ?HENT:  Positive for congestion, rhinorrhea and sore throat. Negative for dental problem, drooling, ear discharge, ear pain, facial swelling, hearing loss, mouth sores, nosebleeds, postnasal drip, sinus pressure, sinus pain, sneezing, tinnitus, trouble swallowing and voice change.   ?Respiratory:  Positive for cough and chest tightness. Negative for apnea, choking, shortness of breath, wheezing and stridor.   ?Cardiovascular: Negative.   ?Gastrointestinal: Negative.   ? ? ?Physical Exam ?Triage Vital Signs ?ED Triage Vitals  ?Enc Vitals Group  ?   BP 01/15/22 0946 120/88  ?   Pulse Rate 01/15/22 0946 78  ?   Resp 01/15/22 0946 18  ?  Temp 01/15/22 0946 97.9 ?F (36.6 ?C)  ?   Temp Source 01/15/22 0946 Oral  ?   SpO2 01/15/22 0946 100 %  ?   Weight 01/15/22 0944 160 lb (72.6 kg)  ?   Height 01/15/22 0944 5\' 3"  (1.6 m)  ?   Head Circumference --   ?   Peak Flow --   ?   Pain Score 01/15/22 0943 5  ?   Pain Loc --   ?   Pain Edu? --   ?   Excl. in [place]? --   ? ?No data found. ? ?Updated Vital Signs ?BP 120/88 (BP Location: Left Arm)   Pulse 78   Temp 97.9 ?F (36.6 ?C) (Oral)   Resp 18   Ht 5\' 3"  (1.6 m)   Wt 160 lb (72.6 kg)   LMP 12/23/2021 (Exact Date)   SpO2 100%   BMI 28.34 kg/m?  ? ?Visual Acuity ?Right Eye Distance:   ?Left Eye Distance:   ?Bilateral Distance:   ? ?Right Eye Near:   ?Left Eye Near:    ?Bilateral Near:    ? ?Physical Exam ?Constitutional:   ?   Appearance: She is well-developed.  ?HENT:  ?    Head: Normocephalic.  ?   Right Ear: Tympanic membrane and ear canal normal.  ?   Left Ear: Tympanic membrane and ear canal normal.  ?   Nose: Congestion present. No rhinorrhea.  ?   Mouth/Throat:  ?   Mouth: Mucous membranes are moist.  ?   Pharynx: Posterior oropharyngeal erythema present.  ?   Tonsils: No tonsillar exudate. 1+ on the right. 1+ on the left.  ?Cardiovascular:  ?   Rate and Rhythm: Normal rate and regular rhythm.  ?   Heart sounds: Normal heart sounds.  ?Pulmonary:  ?   Effort: Pulmonary effort is normal.  ?   Breath sounds: Normal breath sounds.  ?Musculoskeletal:  ?   Cervical back: Normal range of motion.  ?Lymphadenopathy:  ?   Cervical: Cervical adenopathy present.  ?Skin: ?   General: Skin is warm and dry.  ?Neurological:  ?   General: No focal deficit present.  ?   Mental Status: She is alert and oriented to person, place, and time.  ?Psychiatric:     ?   Mood and Affect: Mood normal.     ?   Behavior: Behavior normal.  ? ? ? ?UC Treatments / Results  ?Labs ?(all labs ordered are listed, but only abnormal results are displayed) ?Labs Reviewed  ?GROUP A STREP BY PCR  ? ? ?EKG ? ? ?Radiology ?No results found. ? ?Procedures ?Procedures (including critical care time) ? ?Medications Ordered in UC ?Medications - No data to display ? ?Initial Impression / Assessment and Plan / UC Course  ?I have reviewed the triage vital signs and the nursing notes. ? ?Pertinent labs & imaging results that were available during my care of the patient were reviewed by me and considered in my medical decision making (see chart for details). ? ?Upper respiratory infection ? ?Vital signs are stable, patient in no signs of distress, strep PCR negative, discussed findings with patient, due to timeline of illness will move forward with use of bacterial coverage, Augmentin 10-day course prescribed as well as viscous lidocaine as sore throat is most worrisome symptom today, recommended continued use of over-the-counter  medications for additional support, may attempt salt water gargles, throat lozenges, warm liquids and teaspoons of honey as needed, may  follow-up with urgent care as needed for persisting symptoms ?Final Clinical Impressions(s) / UC Diagnoses  ? ?Final diagnoses:  ?None  ? ?Discharge Instructions   ?None ?  ? ?ED Prescriptions   ?None ?  ? ?PDMP not reviewed this encounter. ?  ?Hans Eden, NP ?01/15/22 1042 ? ?

## 2022-01-15 NOTE — ED Triage Notes (Signed)
Patient started with "congestion, cough" about last Friday. Child at home had same thing, and gotten over it. Cough is "only productive in am" & "throat feels swollen". "Feels like needles when swallowing". No known fever. Also, Right ear pain.  ?

## 2022-08-01 ENCOUNTER — Encounter: Payer: Self-pay | Admitting: Obstetrics and Gynecology

## 2022-08-07 ENCOUNTER — Encounter: Payer: Self-pay | Admitting: Obstetrics and Gynecology
